# Patient Record
Sex: Male | Born: 1975 | Race: White | Hispanic: No | Marital: Married | Smoking: Former smoker
Health system: Southern US, Community
[De-identification: ages and names within clinical notes are randomized; demographics above are authoritative.]

## PROBLEM LIST (undated history)

## (undated) DIAGNOSIS — F1020 Alcohol dependence, uncomplicated: Secondary | ICD-10-CM

## (undated) DIAGNOSIS — F4310 Post-traumatic stress disorder, unspecified: Secondary | ICD-10-CM

## (undated) DIAGNOSIS — S069XAA Unspecified intracranial injury with loss of consciousness status unknown, initial encounter: Secondary | ICD-10-CM

## (undated) DIAGNOSIS — K5909 Other constipation: Secondary | ICD-10-CM

## (undated) DIAGNOSIS — S069X9A Unspecified intracranial injury with loss of consciousness of unspecified duration, initial encounter: Secondary | ICD-10-CM

## (undated) HISTORY — DX: Unspecified intracranial injury with loss of consciousness of unspecified duration, initial encounter: S06.9X9A

## (undated) HISTORY — DX: Alcohol dependence, uncomplicated: F10.20

## (undated) HISTORY — DX: Unspecified intracranial injury with loss of consciousness status unknown, initial encounter: S06.9XAA

## (undated) HISTORY — DX: Other constipation: K59.09

## (undated) HISTORY — PX: MYRINGOTOMY: SHX2060

---

## 2006-08-14 ENCOUNTER — Ambulatory Visit: Payer: Self-pay | Admitting: Family Medicine

## 2006-08-25 DIAGNOSIS — F17200 Nicotine dependence, unspecified, uncomplicated: Secondary | ICD-10-CM | POA: Insufficient documentation

## 2006-08-25 DIAGNOSIS — H6120 Impacted cerumen, unspecified ear: Secondary | ICD-10-CM

## 2006-09-24 ENCOUNTER — Encounter: Payer: Self-pay | Admitting: Family Medicine

## 2006-09-24 LAB — CONVERTED CEMR LAB
AST: 23 units/L (ref 0–37)
Albumin: 4.5 g/dL (ref 3.5–5.2)
Alkaline Phosphatase: 58 units/L (ref 39–117)
BUN: 10 mg/dL (ref 6–23)
Glucose, Bld: 79 mg/dL (ref 70–99)
HDL: 51 mg/dL (ref >39)
Hemoglobin: 14.8 g/dL (ref 13.9–16.8)
LDL Cholesterol: 64 mg/dL (ref 0–99)
MCHC: 34.1 g/dL (ref 33.1–35.4)
MCV: 91.2 fL (ref 78.8–100.0)
Potassium: 4.8 meq/L (ref 3.5–5.3)
RDW: 13.6 % (ref 11.5–15.3)
Total Bilirubin: 0.4 mg/dL (ref 0.3–1.2)
Total CHOL/HDL Ratio: 2.6
Triglycerides: 99 mg/dL (ref <150)
VLDL: 20 mg/dL (ref 0–40)

## 2006-09-28 ENCOUNTER — Encounter: Payer: Self-pay | Admitting: Family Medicine

## 2007-02-26 ENCOUNTER — Ambulatory Visit: Payer: Self-pay | Admitting: Family Medicine

## 2009-02-07 ENCOUNTER — Ambulatory Visit: Payer: Self-pay | Admitting: Family Medicine

## 2009-02-07 DIAGNOSIS — J189 Pneumonia, unspecified organism: Secondary | ICD-10-CM | POA: Insufficient documentation

## 2009-02-07 DIAGNOSIS — J029 Acute pharyngitis, unspecified: Secondary | ICD-10-CM | POA: Insufficient documentation

## 2009-02-07 LAB — CONVERTED CEMR LAB: Rapid Strep: NEGATIVE

## 2009-03-28 ENCOUNTER — Ambulatory Visit: Payer: Self-pay | Admitting: Family Medicine

## 2009-03-30 ENCOUNTER — Ambulatory Visit: Payer: Self-pay | Admitting: Family Medicine

## 2009-03-30 DIAGNOSIS — H6092 Unspecified otitis externa, left ear: Secondary | ICD-10-CM

## 2009-04-02 ENCOUNTER — Encounter: Payer: Self-pay | Admitting: Family Medicine

## 2009-04-26 ENCOUNTER — Encounter: Payer: Self-pay | Admitting: Family Medicine

## 2009-06-06 ENCOUNTER — Ambulatory Visit: Payer: Self-pay | Admitting: Family Medicine

## 2009-06-18 ENCOUNTER — Ambulatory Visit: Payer: Self-pay | Admitting: Family Medicine

## 2009-06-18 DIAGNOSIS — M26629 Arthralgia of temporomandibular joint, unspecified side: Secondary | ICD-10-CM | POA: Insufficient documentation

## 2009-10-22 ENCOUNTER — Ambulatory Visit: Payer: Self-pay | Admitting: Family Medicine

## 2009-10-22 DIAGNOSIS — K5909 Other constipation: Secondary | ICD-10-CM

## 2009-10-22 DIAGNOSIS — R0789 Other chest pain: Secondary | ICD-10-CM

## 2009-10-23 LAB — CONVERTED CEMR LAB
AST: 23 units/L (ref 0–37)
Albumin: 4.8 g/dL (ref 3.5–5.2)
Alkaline Phosphatase: 61 units/L (ref 39–117)
Basophils Absolute: 0 10*3/uL (ref 0.0–0.1)
Basophils Relative: 1 % (ref 0–1)
Glucose, Bld: 81 mg/dL (ref 70–99)
Hemoglobin: 15 g/dL (ref 13.0–17.0)
LDL Cholesterol: 127 mg/dL — ABNORMAL HIGH (ref 0–99)
Lymphocytes Relative: 35 % (ref 12–46)
MCHC: 33.7 g/dL (ref 30.0–36.0)
Monocytes Absolute: 0.5 10*3/uL (ref 0.1–1.0)
Neutro Abs: 2.9 10*3/uL (ref 1.7–7.7)
Neutrophils Relative %: 50 % (ref 43–77)
Platelets: 195 10*3/uL (ref 150–400)
Potassium: 4.4 meq/L (ref 3.5–5.3)
RDW: 12.7 % (ref 11.5–15.5)
Sodium: 137 meq/L (ref 135–145)
TSH: 0.838 microintl units/mL (ref 0.350–4.500)
Total Bilirubin: 0.4 mg/dL (ref 0.3–1.2)
Total Protein: 7.3 g/dL (ref 6.0–8.3)
Triglycerides: 76 mg/dL (ref <150)
VLDL: 15 mg/dL (ref 0–40)

## 2011-09-23 ENCOUNTER — Encounter: Payer: Self-pay | Admitting: Family Medicine

## 2011-09-23 ENCOUNTER — Ambulatory Visit (INDEPENDENT_AMBULATORY_CARE_PROVIDER_SITE_OTHER): Admitting: Family Medicine

## 2011-09-23 VITALS — BP 111/71 | HR 60 | Ht 71.0 in | Wt 181.0 lb

## 2011-09-23 DIAGNOSIS — R599 Enlarged lymph nodes, unspecified: Secondary | ICD-10-CM

## 2011-09-23 DIAGNOSIS — L678 Other hair color and hair shaft abnormalities: Secondary | ICD-10-CM

## 2011-09-23 DIAGNOSIS — H6692 Otitis media, unspecified, left ear: Secondary | ICD-10-CM

## 2011-09-23 DIAGNOSIS — L738 Other specified follicular disorders: Secondary | ICD-10-CM

## 2011-09-23 DIAGNOSIS — R591 Generalized enlarged lymph nodes: Secondary | ICD-10-CM

## 2011-09-23 DIAGNOSIS — L739 Follicular disorder, unspecified: Secondary | ICD-10-CM

## 2011-09-23 DIAGNOSIS — H6690 Otitis media, unspecified, unspecified ear: Secondary | ICD-10-CM

## 2011-09-23 MED ORDER — CLINDAMYCIN PHOSPHATE 1 % EX SOLN: CUTANEOUS | Status: AC

## 2011-09-23 MED ORDER — AMOXICILLIN-POT CLAVULANATE 875-125 MG PO TABS: 1.0000 | ORAL_TABLET | Freq: Two times a day (BID) | ORAL | Status: AC

## 2011-09-23 NOTE — Patient Instructions (Signed)
Lymphadenopathy Lymphadenopathy means "disease of the lymph glands." But the term is usually used to describe swollen or enlarged lymph glands, also called lymph nodes. These are the bean-shaped organs found in many locations including the neck, underarm, and groin. Lymph glands are part of the immune system, which fights infections in your body. Lymphadenopathy can occur in just one area of the body, such as the neck, or it can be generalized, with lymph node enlargement in several areas. The nodes found in the neck are the most common sites of lymphadenopathy. CAUSES  When your immune system responds to germs (such as viruses or bacteria ), infection-fighting cells and fluid build up. This causes the glands to grow in size. This is usually not something to worry about. Sometimes, the glands themselves can become infected and inflamed. This is called lymphadenitis. Enlarged lymph nodes can be caused by many diseases:  Bacterial disease, such as strep throat or a skin infection.   Viral disease, such as a common cold.   Other germs, such as lyme disease, tuberculosis, or sexually transmitted diseases.   Cancers, such as lymphoma (cancer of the lymphatic system) or leukemia (cancer of the white blood cells).   Inflammatory diseases such as lupus or rheumatoid arthritis.   Reactions to medications.  Many of the diseases above are rare, but important. This is why you should see your caregiver if you have lymphadenopathy. SYMPTOMS   Swollen, enlarged lumps in the neck, back of the head or other locations.   Tenderness.   Warmth or redness of the skin over the lymph nodes.   Fever.  DIAGNOSIS  Enlarged lymph nodes are often near the source of infection. They can help healthcare providers diagnose your illness. For instance:   Swollen lymph nodes around the jaw might be caused by an infection in the mouth.   Enlarged glands in the neck often signal a throat infection.   Lymph nodes that  are swollen in more than one area often indicate an illness caused by a virus.  Your caregiver most likely will know what is causing your lymphadenopathy after listening to your history and examining you. Blood tests, x-rays or other tests may be needed. If the cause of the enlarged lymph node cannot be found, and it does not go away by itself, then a biopsy may be needed. Your caregiver will discuss this with you. TREATMENT  Treatment for your enlarged lymph nodes will depend on the cause. Many times the nodes will shrink to normal size by themselves, with no treatment. Antibiotics or other medicines may be needed for infection. Only take over-the-counter or prescription medicines for pain, discomfort or fever as directed by your caregiver. HOME CARE INSTRUCTIONS  Swollen lymph glands usually return to normal when the underlying medical condition goes away. If they persist, contact your health-care provider. He/she might prescribe antibiotics or other treatments, depending on the diagnosis. Take any medications exactly as prescribed. Keep any follow-up appointments made to check on the condition of your enlarged nodes.  SEEK MEDICAL CARE IF:   Swelling lasts for more than two weeks.   You have symptoms such as weight loss, night sweats, fatigue or fever that does not go away.   The lymph nodes are hard, seem fixed to the skin or are growing rapidly.   Skin over the lymph nodes is red and inflamed. This could mean there is an infection.  SEEK IMMEDIATE MEDICAL CARE IF:   Fluid starts leaking from the area of the   enlarged lymph node.   You develop a fever of 102 F (38.9 C) or greater.   Severe pain develops (not necessarily at the site of a large lymph node).   You develop chest pain or shortness of breath.   You develop worsening abdominal pain.  MAKE SURE YOU:   Understand these instructions.   Will watch your condition.   Will get help right away if you are not doing well or get  worse.  Document Released: 08/12/2008 Document Revised: 07/16/2011 Document Reviewed: 08/12/2008 [facility] Patient Information 2012 [place], [place].Otitis Media with Effusion Otitis media with effusion is the presence of fluid in the middle ear. This is a common problem that often follows ear infections. It may be present for weeks or longer after the infection. Unlike an acute ear infection, otits media with effusion refers only to fluid behind the ear drum and not infection. Children with repeated ear and sinus infections and allergy problems are the most likely to get otitis media with effusion. CAUSES  The most frequent cause of the fluid buildup is dysfunction of the eustacian tubes. These are the tubes that drain fluid in the ears to the throat. SYMPTOMS   The main symptom of this condition is hearing loss. As a result, you or your child may:   Listen to the TV at a loud volume.   Not respond to questions.   Ask "what" often when spoken to.   There may be a sensation of fullness or pressure but usually not pain.  DIAGNOSIS   Your caregiver will diagnose this condition by examining you or your child's ears.   Your caregiver may test the pressure in you or your child's ear with a tympanometer.   A hearing test may be conducted if the problem persists.   A caregiver will want to re-evaluate the condition periodically to see if it improves.  TREATMENT   Treatment depends on the duration and the effects of the effusion.   Antibiotics, decongestants, nose drops, and cortisone-type drugs may not be helpful.   Children with persistent ear effusions may have delayed language. Children at risk for developmental delays in hearing, learning, and speech may require referral to a specialist earlier than children not at risk.   You or your child's caregiver may suggest a referral to an Ear, Nose, and Throat (ENT) surgeon for treatment. The following may help restore normal hearing:    Drainage of fluid.   Placement of ear tubes (tympanostomy tubes).   Removal of adenoids (adenoidectomy).  HOME CARE INSTRUCTIONS   Avoid second hand smoke.   Infants who are breast fed are less likely to have this condition.   Avoid feeding infants while laying flat.   Avoid known environmental allergens.   Be sure to see a caregiver or an ENT specialist for follow up.   Avoid people who are sick.  SEEK MEDICAL CARE IF:   Hearing is not better in 3 months.   Hearing is worse.   Ear pain.   Drainage from the ear.   Dizziness.  Document Released: 12/11/2004 Document Revised: 07/16/2011 Document Reviewed: 03/26/2010 [facility] Patient Information 2012 [place], [place].Folliculitis  Folliculitis is an infection and inflammation of the hair follicles. Hair follicles become red and irritated. This inflammation is usually caused by bacteria. The bacteria thrive in warm, moist environments. This condition can be seen anywhere on the body.  CAUSES The most common cause of folliculitis is an infection by germs (bacteria). Fungal and viral infections can  also cause the condition. Viral infections may be more common in people whose bodies are unable to fight disease well (weakened immune systems). Examples include people with:  AIDS.   An organ transplant.   Cancer.  People with depressed immune systems, diabetes, or obesity, have a greater risk of getting folliculitis than the general population. Certain chemicals, especially oils and tars, also can cause folliculitis. SYMPTOMS  An early sign of folliculitis is a small, white or yellow pus-filled, itchy lesion (pustule). These lesions appear on a red, inflamed follicle. They are usually less than 5 mm (.20 inches).   The most likely starting points are the scalp, thighs, legs, back and buttocks. Folliculitis is also frequently found in areas of repeated shaving.   When an infection of the follicle goes deeper, it becomes a boil  or furuncle. A group of closely packed boils create a larger lesion (a carbuncle). These sores (lesions) tend to occur in hairy, sweaty areas of the body.  TREATMENT   A doctor who specializes in skin problems (dermatologists) treats mild cases of folliculitis with antiseptic washes.   They also use a skin application which kills germs (topical antibiotics). Tea tree oil is a good topical antiseptic as well. It can be found at a health food store. A small percentage of individuals may develop an allergy to the tea tree oil.   Mild to moderate boils respond well to warm water compresses applied three times daily.   In some cases, oral antibiotics should be taken with the skin treatment.   If lesions contain large quantities of pus or fluid, your caregiver may drain them. This allows the topical antibiotics to get to the affected areas better.   Stubborn cases of folliculitis may respond to laser hair removal. This process uses a high intensity light beam (a laser) to destroy the follicle and reduces the scarring from folliculitis. After laser hair removal, hair will no longer grow in the laser treated area.  Patients with long-lasting folliculitis need to find out where the infection is coming from. Germs can live in the nostrils of the patient. This can trigger an outbreak now and then. Sometimes the bacteria live in the nostrils of a family member. This person does not develop the disorder but they repeatedly re-expose others to the germ. To break the cycle of recurrence in the patient, the family member must also undergo treatment. PREVENTION   Individuals who are predisposed to folliculitis should be extremely careful about personal hygiene.   Application of antiseptic washes may help prevent recurrences.   A topical antibiotic cream, mupirocin (Bactroban), has been effective at reducing bacteria in the nostrils. It is applied inside the nose with your little finger. This is done twice daily  for a week. Then it is repeated every 6 months.   Because follicle disorders tend to come back, patients must receive follow-up care. Your caregiver may be able to recognize a recurrence before it becomes severe.  SEEK IMMEDIATE MEDICAL CARE IF:   You develop redness, swelling, or increasing pain in the area.   You have a fever.   You are not improving with treatment or are getting worse.   You have any other questions or concerns.  Document Released: 01/12/2002 Document Revised: 07/16/2011 Document Reviewed: 11/08/2008 [facility] Patient Information 2012 [place], [place].
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
## 2011-10-07 ENCOUNTER — Encounter: Payer: Self-pay | Admitting: Family Medicine

## 2011-10-17 ENCOUNTER — Encounter: Payer: Self-pay | Admitting: Family Medicine

## 2011-10-23 ENCOUNTER — Ambulatory Visit: Admitting: Family Medicine

## 2011-11-18 HISTORY — PX: OTHER SURGICAL HISTORY: SHX169

## 2011-11-20 ENCOUNTER — Ambulatory Visit: Admitting: Family Medicine

## 2012-02-25 ENCOUNTER — Encounter: Payer: Self-pay | Admitting: *Deleted

## 2012-02-25 ENCOUNTER — Emergency Department (INDEPENDENT_AMBULATORY_CARE_PROVIDER_SITE_OTHER)
Admission: EM | Admit: 2012-02-25 | Discharge: 2012-02-25 | Disposition: A | Discharge status: Home / Self Care | Source: Home / Self Care

## 2012-02-25 DIAGNOSIS — M545 Low back pain, unspecified: Secondary | ICD-10-CM
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
## 2012-02-26 ENCOUNTER — Telehealth: Payer: Self-pay | Admitting: *Deleted

## 2012-02-27 ENCOUNTER — Telehealth: Payer: Self-pay | Admitting: *Deleted

## 2012-02-27 NOTE — ED Notes (Signed)
Spoke to pts wife given MRI results. Faxed results to Dr. Lazaro Arms office for appt on 03/01/12.

## 2012-03-01 ENCOUNTER — Ambulatory Visit: Admitting: Family Medicine

## 2013-04-15 ENCOUNTER — Telehealth: Payer: Self-pay

## 2013-04-15 DIAGNOSIS — F4310 Post-traumatic stress disorder, unspecified: Secondary | ICD-10-CM

## 2013-04-15 NOTE — Telephone Encounter (Signed)
No problem. Referral placed. Hopefully they will contact him next week. They can ask him if he would prefer to see a psychiatrist who prescribes medication or more of a therapist.

## 2013-04-15 NOTE — Telephone Encounter (Signed)
Charles Crosby needs a referral for [facility]. He has PTSD.

## 2013-04-29 ENCOUNTER — Encounter (HOSPITAL_COMMUNITY): Payer: Self-pay | Admitting: Psychiatry

## 2013-04-29 ENCOUNTER — Ambulatory Visit (INDEPENDENT_AMBULATORY_CARE_PROVIDER_SITE_OTHER): Payer: Federal, State, Local not specified - PPO | Admitting: Psychiatry

## 2013-04-29 VITALS — BP 108/69 | HR 60 | Ht 69.75 in | Wt 172.0 lb

## 2013-04-29 DIAGNOSIS — F1910 Other psychoactive substance abuse, uncomplicated: Secondary | ICD-10-CM

## 2013-04-29 DIAGNOSIS — F1020 Alcohol dependence, uncomplicated: Secondary | ICD-10-CM

## 2013-04-29 DIAGNOSIS — F4310 Post-traumatic stress disorder, unspecified: Secondary | ICD-10-CM

## 2013-04-29 MED ORDER — SERTRALINE HCL 100 MG PO TABS: 100.0000 mg | ORAL_TABLET | Freq: Every day | ORAL | Status: DC

## 2013-04-29 MED ORDER — TRAZODONE HCL 50 MG PO TABS: ORAL_TABLET | ORAL | Status: DC
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
## 2013-05-13 ENCOUNTER — Ambulatory Visit (INDEPENDENT_AMBULATORY_CARE_PROVIDER_SITE_OTHER): Payer: Federal, State, Local not specified - PPO | Admitting: Psychiatry

## 2013-05-13 ENCOUNTER — Encounter (HOSPITAL_COMMUNITY): Payer: Self-pay | Admitting: Psychiatry

## 2013-05-13 VITALS — BP 108/68 | HR 65 | Wt 174.0 lb

## 2013-05-13 DIAGNOSIS — F1020 Alcohol dependence, uncomplicated: Secondary | ICD-10-CM

## 2013-05-13 DIAGNOSIS — F4310 Post-traumatic stress disorder, unspecified: Secondary | ICD-10-CM

## 2013-05-13 DIAGNOSIS — F1910 Other psychoactive substance abuse, uncomplicated: Secondary | ICD-10-CM
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
## 2013-05-19 ENCOUNTER — Ambulatory Visit (INDEPENDENT_AMBULATORY_CARE_PROVIDER_SITE_OTHER): Payer: Federal, State, Local not specified - PPO | Admitting: Sports Medicine

## 2013-05-19 ENCOUNTER — Encounter: Payer: Self-pay | Admitting: Sports Medicine

## 2013-05-19 VITALS — BP 114/74 | HR 67 | Wt 176.0 lb

## 2013-05-19 DIAGNOSIS — S61209A Unspecified open wound of unspecified finger without damage to nail, initial encounter: Secondary | ICD-10-CM

## 2013-05-19 DIAGNOSIS — S61213A Laceration without foreign body of left middle finger without damage to nail, initial encounter: Secondary | ICD-10-CM | POA: Insufficient documentation

## 2013-05-19 MED ORDER — HYDROCODONE-ACETAMINOPHEN 5-325 MG PO TABS: 1.0000 | ORAL_TABLET | Freq: Three times a day (TID) | ORAL | Status: DC | PRN

## 2013-05-19 NOTE — Assessment & Plan Note (Signed)
This occurred about 24 hours ago. Up-to-date on tetanus. Sutured at [facility] in [place]. Was having significant pain, hydrocodone prescribed. No signs of infection, [facility] will remove the sutures.

## 2013-05-19 NOTE — Progress Notes (Signed)
  Subjective:    CC: Finger injury  HPI: Charles Crosby accidentally cut his left middle finger on a table saw. He went to [facility] in [place], and had sutures placed. He was not given any pain medication, and comes here for further advice. He already has a followup scheduled for suture removal. Pain is localized, doesn't radiate, moderate. He denies any fevers, chills, drainage from the incision.  Past medical history, Surgical history, Family history not pertinant except as noted below, Social history, Allergies, and medications have been entered into the medical record, reviewed, and no changes needed.   Review of Systems: No fevers, chills, night sweats, weight loss, chest pain, or shortness of breath.   Objective:    General: Well Developed, well nourished, and in no acute distress.  Neuro: Alert and oriented x3, extra-ocular muscles intact, sensation grossly intact.  HEENT: Normocephalic, atraumatic, pupils equal round reactive to light, neck supple, no masses, no lymphadenopathy, thyroid nonpalpable.  Skin: Warm and dry, no rashes. Cardiac: Regular rate and rhythm, no murmurs rubs or gallops, no lower extremity edema.  Respiratory: Clear to auscultation bilaterally. Not using accessory muscles, speaking in full sentences. Left hand: Simple interrupted sutures visible and well placed in the left middle finger pad. There is no erythema, no induration, there is minimal tenderness to palpation, he has excellent function to the flexor digitorum superficialis, profundus, as well as the extensors. He is neurovascularly intact distally.  Impression and Recommendations:

## 2013-05-23 ENCOUNTER — Ambulatory Visit: Payer: Self-pay | Admitting: Sports Medicine

## 2013-06-09 ENCOUNTER — Ambulatory Visit (HOSPITAL_COMMUNITY): Payer: Self-pay | Admitting: Psychiatry

## 2013-06-21 ENCOUNTER — Encounter (HOSPITAL_COMMUNITY): Payer: Self-pay | Admitting: Psychiatry

## 2013-06-21 ENCOUNTER — Ambulatory Visit (INDEPENDENT_AMBULATORY_CARE_PROVIDER_SITE_OTHER): Payer: Federal, State, Local not specified - PPO | Admitting: Psychiatry

## 2013-06-21 VITALS — BP 99/68 | HR 72 | Ht 69.75 in | Wt 178.0 lb

## 2013-06-21 DIAGNOSIS — F1020 Alcohol dependence, uncomplicated: Secondary | ICD-10-CM

## 2013-06-21 DIAGNOSIS — F4310 Post-traumatic stress disorder, unspecified: Secondary | ICD-10-CM

## 2013-06-21 DIAGNOSIS — F1910 Other psychoactive substance abuse, uncomplicated: Secondary | ICD-10-CM

## 2013-06-21 MED ORDER — SERTRALINE HCL 25 MG PO TABS: ORAL_TABLET | ORAL | Status: DC

## 2013-06-21 MED ORDER — FLUOXETINE HCL 10 MG PO CAPS: ORAL_CAPSULE | ORAL | Status: DC
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
## 2013-06-30 ENCOUNTER — Other Ambulatory Visit (HOSPITAL_COMMUNITY): Payer: Self-pay | Admitting: Psychiatry

## 2013-07-04 ENCOUNTER — Telehealth (HOSPITAL_COMMUNITY): Payer: Self-pay
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
## 2013-07-08 ENCOUNTER — Ambulatory Visit (HOSPITAL_COMMUNITY): Payer: Self-pay | Admitting: Psychiatry

## 2013-07-20 ENCOUNTER — Telehealth (HOSPITAL_COMMUNITY): Payer: Self-pay | Admitting: Psychiatry

## 2013-07-20 NOTE — Telephone Encounter (Signed)
Message copied by Larena Sox on Wed Jul 20, 2013  5:51 PM ------      Message from: Larena Sox      Created: Fri Jul 15, 2013  5:49 PM       Call Patient and Dr. Linford Arnold ------

## 2013-07-20 NOTE — Telephone Encounter (Signed)
Called patient's wife, as the patient has not called Dr. Linford Arnold regarding episodes of chest pains and SOB with exertion. Will send message to Dr. Linford Arnold.

## 2013-07-20 NOTE — Telephone Encounter (Signed)
Called patient. Left message asking him to speak to his PCP.

## 2013-07-21 NOTE — Telephone Encounter (Signed)
Spoke with wife and gave pt appt for 07/22/13 @ 11am with Dr. Linford Arnold. Pt unable to come in today due to being in [place] per wife.  Meyer Cory, LPN

## 2013-07-21 NOTE — Telephone Encounter (Signed)
Please try to call this pt to see if ha can come in.

## 2013-07-22 ENCOUNTER — Encounter: Payer: Self-pay | Admitting: Family Medicine

## 2013-07-22 ENCOUNTER — Ambulatory Visit (INDEPENDENT_AMBULATORY_CARE_PROVIDER_SITE_OTHER): Payer: Federal, State, Local not specified - PPO | Admitting: Psychiatry

## 2013-07-22 ENCOUNTER — Ambulatory Visit (INDEPENDENT_AMBULATORY_CARE_PROVIDER_SITE_OTHER): Payer: Federal, State, Local not specified - PPO | Admitting: Family Medicine

## 2013-07-22 ENCOUNTER — Encounter (HOSPITAL_COMMUNITY): Payer: Self-pay | Admitting: Psychiatry

## 2013-07-22 VITALS — BP 103/73 | HR 63 | Ht 69.75 in | Wt 172.0 lb

## 2013-07-22 VITALS — BP 98/61 | HR 69 | Wt 174.0 lb

## 2013-07-22 DIAGNOSIS — F4310 Post-traumatic stress disorder, unspecified: Secondary | ICD-10-CM

## 2013-07-22 DIAGNOSIS — R599 Enlarged lymph nodes, unspecified: Secondary | ICD-10-CM

## 2013-07-22 DIAGNOSIS — F1020 Alcohol dependence, uncomplicated: Secondary | ICD-10-CM

## 2013-07-22 DIAGNOSIS — R0789 Other chest pain: Secondary | ICD-10-CM

## 2013-07-22 DIAGNOSIS — Z720 Tobacco use: Secondary | ICD-10-CM

## 2013-07-22 DIAGNOSIS — R590 Localized enlarged lymph nodes: Secondary | ICD-10-CM

## 2013-07-22 DIAGNOSIS — F17200 Nicotine dependence, unspecified, uncomplicated: Secondary | ICD-10-CM

## 2013-07-22 DIAGNOSIS — F1910 Other psychoactive substance abuse, uncomplicated: Secondary | ICD-10-CM

## 2013-07-22 MED ORDER — FLUOXETINE HCL 40 MG PO CAPS: 40.0000 mg | ORAL_CAPSULE | Freq: Every day | ORAL | Status: DC
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
## 2013-07-22 NOTE — Progress Notes (Signed)
Subjective:    Patient ID: Charles Crosby, male    DOB: 04/21/1976, 37 y.o.   MRN: [id number]  HPI CP episodes on and off x 1 month. Sometimes a dull pressure or a sharp pain. Usu last about seconds if the sharp pain. If it is a dull pain can last a couple of hours.  Will wax and wane.  No relationship to esating or not eating. On his left side of his chest.  Will occ feel SOB once gets the BP.  No CP or SOB w/ exercise.  No fam hx of premature Hert dz. No GERD.  Using e-cigs and was taking in 6 Monster drinks a day.  Now has cut back to 2-3 drinks per day. No heart racing.  No prior hx of hear ot lung problems. No CP at nigth. No GERD. He does have a family history of thyroid problems. Also note that he is currently transitioning off the sertraline and onto fluoxetine. His anxiety has been elevated a little bit during this transition period.  Also has a lump that comes and goes behind his left ear.  Non tender. No fever.   Review of Systems BP 98/61  Pulse 69  Wt 174 lb (78.926 kg)  BMI 25.14 kg/m2  SpO2 96%    No Known Allergies  Past Medical History  Diagnosis Date  . Alcohol dependency     history  . Constipation, chronic   . Hearing loss     Both ears, left ear is worse    Past Surgical History  Procedure Laterality Date  . Myringotomy    . Vasectomy  2013    History   Social History  . Marital Status: Married    Spouse Name: N/A    Number of Children: N/A  . Years of Education: N/A   Occupational History  . Not on file.   Social History Main Topics  . Smoking status: Former Smoker -- 1.00 packs/day for 12 years  . Smokeless tobacco: Current User    Types: Chew     Comment: Currently using e-cigarette-15 mg  . Alcohol Use: No     Comment: one relapse since March 13, 2013  . Drug Use: No  . Sexual Activity: Yes    Partners: Male     Comment: Vasectomy   Other Topics Concern  . Not on file   Social History Narrative  . No narrative on file    Family  History  Problem Relation Age of Onset  . Hypertension Father   . Hyperlipidemia Father   . Thyroid disease Father   . Alcohol abuse Neg Hx   . OCD Neg Hx   . Paranoid behavior Neg Hx   . Schizophrenia Neg Hx   . Diabetes type II Neg Hx     Outpatient Encounter Prescriptions as of 07/22/2013  Medication Sig Dispense Refill  . FLUoxetine (PROZAC) 40 MG capsule Take 1 capsule (40 mg total) by mouth at bedtime.  30 capsule  1  . sertraline (ZOLOFT) 25 MG tablet 50 mg. 50 mg (2 tablets)  for 7 days, then 25 mg  (1 tablets)for 7 days then stop.       No facility-administered encounter medications on file as of 07/22/2013.          Objective:   Physical Exam  Constitutional: He is oriented to person, place, and time. He appears well-developed and well-nourished.  HENT:  Head: Normocephalic and atraumatic.  Neck: Neck supple. No thyromegaly  present.  Cardiovascular: Normal rate, regular rhythm and normal heart sounds.   Radial pulse 2+, pedal pulse 2+, no carotid bruits.  Pulmonary/Chest: Effort normal and breath sounds normal.  Musculoskeletal: He exhibits no edema.  Lymphadenopathy:    He has no cervical adenopathy.  Neurological: He is alert and oriented to person, place, and time.  Skin: Skin is warm and dry.  Swollen LN post to left ear. Also has some excoriated papules on teh scalp near the left ear as well.    Psychiatric: He has a normal mood and affect. His behavior is normal.     The lymph node is approximately 1 x 0.5 cm in size. No erythema overlying the lymph node. No significant tenderness.     Assessment & Plan:  Atypical CP  - fortunately, he is not having any pain or problems during exercise which is fantastic. He says no major risk factors like high blood pressure or hyperlipidemia. In fact he has great blood pressure and pulse today. He does use the ease cigarettes which is concerning. I padded couple patients but experienced chest pain with using these. I  encouraged cessation. Will get additional blood work to rule out anemia, thyroid disease, and check cardiac enzymes. He EKG was reassuring today. EKG showed rate of 57 beats per minute, normal sinus rhythm, with no acute ST-T wave changes and normal axis. Strongly encouraged him to also cut back on his caffeine intake. It is a stimulant and typically can cause palpitations and chest pain.  No GERD sxs.   Reactive LN - explained lymph node is most likely just reactive to excoriated papules on his scalp. Encouraged him to not pick at them or scratch at them and the lymph node should resolve on its own over the next 3-4 weeks. He notices it getting larger, turning red or getting tender or he feels it's getting more Farmer hard liquor walk he needs to return for further evaluation.

## 2013-07-23 ENCOUNTER — Telehealth: Payer: Self-pay | Admitting: Sports Medicine

## 2013-07-23 LAB — COMPLETE METABOLIC PANEL WITH GFR
ALT: 78 U/L — ABNORMAL HIGH (ref 0–53)
AST: 309 U/L — ABNORMAL HIGH (ref 0–37)
Albumin: 4.2 g/dL (ref 3.5–5.2)
Alkaline Phosphatase: 53 U/L (ref 39–117)
Calcium: 9.1 mg/dL (ref 8.4–10.5)
Chloride: 104 mEq/L (ref 96–112)
Potassium: 4.2 mEq/L (ref 3.5–5.3)
Sodium: 140 mEq/L (ref 135–145)
Total Protein: 6.6 g/dL (ref 6.0–8.3)

## 2013-07-23 LAB — CBC
MCH: 29.1 pg (ref 26.0–34.0)
MCHC: 33.7 g/dL (ref 30.0–36.0)
MCV: 86.6 fL (ref 78.0–100.0)
Platelets: 249 10*3/uL (ref 150–400)
RDW: 13 % (ref 11.5–15.5)
WBC: 6.5 10*3/uL (ref 4.0–10.5)

## 2013-07-23 LAB — CK TOTAL AND CKMB (NOT AT ARMC): Relative Index: 0.1 (ref 0.0–2.5)

## 2013-07-23 NOTE — Telephone Encounter (Addendum)
     Called by on-call-nurse regarding this patient, he is a healthy male with PTSD.  He regularly consumes 3+ energy drinks per day.  More recently he has been having episodes of atypical chest pain per both psychiatrist and PCP notes today.  CP comes when smoking.  Usually not exertional, mild SOB associated with CP per notes, can last a few hours, did not sound cardiac.  In office ECG was negative and enzymes were checked.  Unfortunately his CK total was ~15000 with MB component of 10.  Troponins were normal.  This does not represent an ACS but does likely represent moderate rhabdomyolysis which would benefit at least initially from some IVF.  Since I cannot examine him now I am sending him to the [facility] ED.  Patient will be called by answering service and directed to [facility] ED.  I have asked them to call me if they can't reach him.  Of note he is also tapering to a different SSRI, so currently may be on two so there is also a concern for serotonin syndrome.

## 2013-07-25 ENCOUNTER — Other Ambulatory Visit: Payer: Self-pay | Admitting: Family Medicine

## 2013-07-25 ENCOUNTER — Telehealth: Payer: Self-pay | Admitting: *Deleted

## 2013-07-25 ENCOUNTER — Encounter: Payer: Self-pay | Admitting: *Deleted

## 2013-07-25 ENCOUNTER — Encounter: Payer: Self-pay | Admitting: Family Medicine

## 2013-07-25 ENCOUNTER — Ambulatory Visit (INDEPENDENT_AMBULATORY_CARE_PROVIDER_SITE_OTHER): Payer: Federal, State, Local not specified - PPO | Admitting: Family Medicine

## 2013-07-25 VITALS — BP 103/62 | HR 71 | Wt 178.0 lb

## 2013-07-25 DIAGNOSIS — M6282 Rhabdomyolysis: Secondary | ICD-10-CM

## 2013-07-25 DIAGNOSIS — R748 Abnormal levels of other serum enzymes: Secondary | ICD-10-CM

## 2013-07-25 LAB — CK TOTAL AND CKMB (NOT AT ARMC)
CK, MB: 3.8 ng/mL (ref 0.3–4.0)
Relative Index: 0.2 (ref 0.0–2.5)
Total CK: 2511 U/L — ABNORMAL HIGH (ref 7–232)

## 2013-07-25 LAB — HEPATIC FUNCTION PANEL
AST: 311 U/L — ABNORMAL HIGH (ref 0–37)
Albumin: 4.3 g/dL (ref 3.5–5.2)
Bilirubin, Direct: 0.1 mg/dL (ref 0.0–0.3)
Total Bilirubin: 0.4 mg/dL (ref 0.3–1.2)

## 2013-07-25 NOTE — Progress Notes (Signed)
Called [facility] and spoke with [name] ask her to add on an acute hep panel to [id number].Charles Crosby

## 2013-07-25 NOTE — Telephone Encounter (Signed)
Lab orders faxed.  Meyer Cory, LPN

## 2013-07-25 NOTE — Progress Notes (Signed)
Subjective:    Patient ID: Charles Crosby, male    DOB: August 14, 1976, 37 y.o.   MRN: [id number]  HPI Micah Flesher ot ED and had IV fluids and was d/c home after CK had come down to about 9000. No CP since then. He is feeling well otherwise. He's been trying to stay really hydrated. About 3 days before he came in he had worked well but he says it was a normal routine. He did some upper body weights and ran about half a mile. Nothing excessive. The he had not been working out regularly before that for about 3 months. He had backed off on his caffeine energy drinks a couple days before as well. Otherwise not exactly sure what may have caused this. He denies any prior history or problems with his liver. No family history of GI or liver problems as well.   Review of Systems  BP 103/62  Pulse 71  Wt 178 lb (80.74 kg)  BMI 25.71 kg/m2    No Known Allergies  Past Medical History  Diagnosis Date  . Alcohol dependency     history  . Constipation, chronic   . Hearing loss     Both ears, left ear is worse    Past Surgical History  Procedure Laterality Date  . Myringotomy    . Vasectomy  2013    History   Social History  . Marital Status: Married    Spouse Name: N/A    Number of Children: N/A  . Years of Education: N/A   Occupational History  . Not on file.   Social History Main Topics  . Smoking status: Former Smoker -- 1.00 packs/day for 12 years  . Smokeless tobacco: Current User    Types: Chew     Comment: Currently using e-cigarette-15 mg  . Alcohol Use: No     Comment: one relapse since March 13, 2013  . Drug Use: No  . Sexual Activity: Yes    Partners: Male     Comment: Vasectomy   Other Topics Concern  . Not on file   Social History Narrative  . No narrative on file    Family History  Problem Relation Age of Onset  . Hypertension Father   . Hyperlipidemia Father   . Thyroid disease Father   . Alcohol abuse Neg Hx   . OCD Neg Hx   . Paranoid behavior Neg Hx   .  Schizophrenia Neg Hx   . Diabetes type II Neg Hx   . Thyroid disease Mother     Outpatient Encounter Prescriptions as of 07/25/2013  Medication Sig Dispense Refill  . FLUoxetine (PROZAC) 40 MG capsule Take 1 capsule (40 mg total) by mouth at bedtime.  30 capsule  1  . sertraline (ZOLOFT) 25 MG tablet 50 mg. 50 mg (2 tablets)  for 7 days, then 25 mg  (1 tablets)for 7 days then stop.       No facility-administered encounter medications on file as of 07/25/2013.          Objective:   Physical Exam  Constitutional: He is oriented to person, place, and time. He appears well-developed and well-nourished.  HENT:  Head: Normocephalic and atraumatic.  Cardiovascular: Normal rate, regular rhythm and normal heart sounds.   Pulmonary/Chest: Effort normal and breath sounds normal.  Abdominal: Soft. Bowel sounds are normal. He exhibits no distension and no mass. There is tenderness. There is no rebound and no guarding.  Mildly tender in the right lower  quadrant close to the umbilicus. Nontender in the right upper quadrant. I did not palpate any hepatosplenomegaly.  Neurological: He is alert and oriented to person, place, and time.  Skin: Skin is warm and dry.  Psychiatric: He has a normal mood and affect. His behavior is normal.          Assessment & Plan:  Rhabdomyolysis-repeat CT of the emergency room had drifted down a little bit. We rechecked his labs today. Hopefully his numbers we'll look better they're still pending. We should have his report back person in the morning. We will need to watch them until he comes back down to normal. In the meantime continue to hydrate really well and avoid exercising. Routine activity is fine. Avoid anything that is dehydrated.  Elevated liver enzymes-unfortunately his liver enzymes did not improve today compared to Friday. I would like to move forward with an ultrasound of the liver as well as an acute hepatitis panel.

## 2013-07-26 ENCOUNTER — Ambulatory Visit (INDEPENDENT_AMBULATORY_CARE_PROVIDER_SITE_OTHER): Payer: Federal, State, Local not specified - PPO

## 2013-07-26 ENCOUNTER — Encounter: Payer: Self-pay | Admitting: *Deleted

## 2013-07-26 DIAGNOSIS — K824 Cholesterolosis of gallbladder: Secondary | ICD-10-CM

## 2013-07-26 DIAGNOSIS — R748 Abnormal levels of other serum enzymes: Secondary | ICD-10-CM

## 2013-07-26 LAB — HEPATITIS PANEL, ACUTE
HCV Ab: NEGATIVE
Hep A IgM: NEGATIVE
Hep B C IgM: NEGATIVE
Hepatitis B Surface Ag: NEGATIVE

## 2013-08-01 ENCOUNTER — Other Ambulatory Visit: Payer: Self-pay | Admitting: Family Medicine

## 2013-08-01 ENCOUNTER — Telehealth: Payer: Self-pay | Admitting: *Deleted

## 2013-08-01 DIAGNOSIS — R7989 Other specified abnormal findings of blood chemistry: Secondary | ICD-10-CM

## 2013-08-01 NOTE — Telephone Encounter (Signed)
Labs entered.

## 2013-08-02 ENCOUNTER — Encounter: Payer: Self-pay | Admitting: Family Medicine

## 2013-08-02 ENCOUNTER — Telehealth: Payer: Self-pay | Admitting: Family Medicine

## 2013-08-02 ENCOUNTER — Ambulatory Visit (INDEPENDENT_AMBULATORY_CARE_PROVIDER_SITE_OTHER): Payer: Federal, State, Local not specified - PPO | Admitting: Family Medicine

## 2013-08-02 VITALS — BP 104/65 | HR 68 | Temp 98.4°F | Wt 176.0 lb

## 2013-08-02 DIAGNOSIS — R1012 Left upper quadrant pain: Secondary | ICD-10-CM

## 2013-08-02 DIAGNOSIS — R1013 Epigastric pain: Secondary | ICD-10-CM

## 2013-08-02 DIAGNOSIS — R51 Headache: Secondary | ICD-10-CM

## 2013-08-02 DIAGNOSIS — K228 Other specified diseases of esophagus: Secondary | ICD-10-CM

## 2013-08-02 MED ORDER — SUCRALFATE 1 GM/10ML PO SUSP: 1.0000 g | Freq: Four times a day (QID) | ORAL | Status: DC

## 2013-08-02 NOTE — Telephone Encounter (Signed)
Call the labs and see if can add liver enzymes

## 2013-08-02 NOTE — Progress Notes (Signed)
Subjective:    Patient ID: Charles Crosby, male    DOB: 1976-07-26, 37 y.o.   MRN: [id number]  HPI Starting yesterday noticed it was painful to swallow. He says he can feel the food moving from the back of his throat all the way down to the bottom of his chest to his stomach. Says it then feels like it gets stuck in the  Lower portion of the chest.  Says it happens with food and with liquids. It's worse with food. No acutal ST.  No vomiting.  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 He also reports that his does grind his teeth at night. Has also had a frontal HA.  Says he actually vomited last night bc it was making him nauseated. Still feels nauseated today. No abd pain. No diarrhea. Pain in the esophagus was worse when he laid down. He is still drinking carbonated energy drinks but says he has cut back. He is still using the cigarettes as well.  No fever, chills or sweats.   Review of Systems  BP 104/65  Pulse 68  Temp(Src) 98.4 F (36.9 C) (Oral)  Wt 176 lb (79.833 kg)  BMI 25.42 kg/m2    No Known Allergies  Past Medical History  Diagnosis Date  . Alcohol dependency     history  . Constipation, chronic   . Hearing loss     Both ears, left ear is worse    Past Surgical History  Procedure Laterality Date  . Myringotomy    . Vasectomy  2013    History   Social History  . Marital Status: Married    Spouse Name: N/A    Number of Children: N/A  . Years of Education: N/A   Occupational History  . Not on file.   Social History Main Topics  . Smoking status: Former Smoker -- 1.00 packs/day for 12 years  . Smokeless tobacco: Current User    Types: Chew     Comment: Currently using e-cigarette-15 mg  . Alcohol Use: No     Comment: one relapse since March 13, 2013  . Drug Use: No  . Sexual Activity: Yes    Partners: Male     Comment: Vasectomy   Other Topics Concern  . Not on file   Social History Narrative  . No narrative on file     Family History  Problem Relation Age of Onset  . Hypertension Father   . Hyperlipidemia Father   . Thyroid disease Father   . Alcohol abuse Neg Hx   . OCD Neg Hx   . Paranoid behavior Neg Hx   . Schizophrenia Neg Hx   . Diabetes type II Neg Hx   . Thyroid disease Mother     Outpatient Encounter Prescriptions as of 08/02/2013  Medication Sig Dispense Refill  . FLUoxetine (PROZAC) 40 MG capsule Take 1 capsule (40 mg total) by mouth at bedtime.  30 capsule  1  . sertraline (ZOLOFT) 25 MG tablet 50 mg. 50 mg (2 tablets)  for 7 days, then 25 mg  (1 tablets)for 7 days then stop.      . sucralfate (CARAFATE) 1 GM/10ML suspension Take 10 mLs (1 g total) by mouth 4 (four) times daily.  420 mL  0   No facility-administered encounter medications on file as of 08/02/2013.          Objective:   Physical Exam  Constitutional: He is oriented to person,  place, and time. He appears well-developed and well-nourished.  HENT:  Head: Normocephalic and atraumatic.  Cardiovascular: Normal rate, regular rhythm and normal heart sounds.   Pulmonary/Chest: Effort normal and breath sounds normal.  Abdominal: Soft. Bowel sounds are normal. He exhibits no distension and no mass. There is tenderness. There is no rebound and no guarding.  TTP in the epigastrium, left upper quadrantt, and below umbilicus.   Neurological: He is alert and oriented to person, place, and time.  Skin: Skin is warm and dry.  Psychiatric: He has a normal mood and affect. His behavior is normal.          Assessment & Plan:  Esophageal pain-discussed with him most likely diagnosis is esophagitis. He does have a prior history of reflux. I gave him some samples of dexilant and also central prescription for Carafate to take before each meal to cut the stomach. If this is the cause then he should get symptomatic relief within 2-3 days. Also consider that he could have an esophageal stricture that typically the symptoms are more  gradual. Also consider esophageal spasm, for example nutcracker esophagus. This could certainly explain his sudden onset of symptoms. We will first start with a PPI and the Carafate. Because he was tender over the abdomen even though he has not complained of abdominal pain I went ahead and got a CBC with differential as well as amylase and lipase since he was also tender in the left upper quadrant. We discussed the importance of cutting back on caffeine, carbonated beverages, greasy spicy foods, acidic foods like citrus fruits, et Karie Soda.

## 2013-08-02 NOTE — Telephone Encounter (Signed)
Liver enzymes added on to specimen #[id number]  .Loralee Pacas [place]

## 2013-08-03 LAB — CBC WITH DIFFERENTIAL/PLATELET
Basophils Absolute: 0.1 10*3/uL (ref 0.0–0.1)
Lymphocytes Relative: 32 % (ref 12–46)
Lymphs Abs: 1.9 10*3/uL (ref 0.7–4.0)
Neutro Abs: 3.1 10*3/uL (ref 1.7–7.7)
Neutrophils Relative %: 52 % (ref 43–77)
Platelets: 269 10*3/uL (ref 150–400)
RBC: 4.72 MIL/uL (ref 4.22–5.81)
RDW: 12.9 % (ref 11.5–15.5)
WBC: 6 10*3/uL (ref 4.0–10.5)

## 2013-08-03 LAB — COMPLETE METABOLIC PANEL WITH GFR
ALT: 23 U/L (ref 0–53)
AST: 25 U/L (ref 0–37)
Albumin: 4.4 g/dL (ref 3.5–5.2)
Calcium: 9.8 mg/dL (ref 8.4–10.5)
Chloride: 103 mEq/L (ref 96–112)
Creat: 1.09 mg/dL (ref 0.50–1.35)
Potassium: 4 mEq/L (ref 3.5–5.3)
Sodium: 140 mEq/L (ref 135–145)
Total Protein: 7.1 g/dL (ref 6.0–8.3)

## 2013-08-03 LAB — LIPASE: Lipase: 20 U/L (ref 0–75)

## 2013-08-03 LAB — HEPATIC FUNCTION PANEL
ALT: 25 U/L (ref 0–53)
AST: 30 U/L (ref 0–37)
Albumin: 4.4 g/dL (ref 3.5–5.2)
Alkaline Phosphatase: 64 U/L (ref 39–117)
Total Bilirubin: 0.4 mg/dL (ref 0.3–1.2)

## 2013-08-08 ENCOUNTER — Ambulatory Visit: Payer: Self-pay | Admitting: Family Medicine

## 2013-08-16 ENCOUNTER — Ambulatory Visit: Payer: Self-pay | Admitting: Family Medicine

## 2013-08-19 ENCOUNTER — Ambulatory Visit (INDEPENDENT_AMBULATORY_CARE_PROVIDER_SITE_OTHER): Payer: Federal, State, Local not specified - PPO | Admitting: Psychiatry

## 2013-08-19 ENCOUNTER — Encounter (HOSPITAL_COMMUNITY): Payer: Self-pay | Admitting: Psychiatry

## 2013-08-19 VITALS — BP 92/61 | HR 76 | Ht 69.75 in | Wt 175.0 lb

## 2013-08-19 DIAGNOSIS — F1910 Other psychoactive substance abuse, uncomplicated: Secondary | ICD-10-CM

## 2013-08-19 DIAGNOSIS — F1020 Alcohol dependence, uncomplicated: Secondary | ICD-10-CM

## 2013-08-19 DIAGNOSIS — F4310 Post-traumatic stress disorder, unspecified: Secondary | ICD-10-CM

## 2013-08-19 MED ORDER — FLUOXETINE HCL 10 MG PO CAPS: ORAL_CAPSULE | ORAL | Status: DC

## 2013-08-19 MED ORDER — FLUOXETINE HCL 40 MG PO CAPS: 40.0000 mg | ORAL_CAPSULE | Freq: Every day | ORAL | Status: DC
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
## 2013-08-20 NOTE — Addendum Note (Signed)
Addended by: Larena Sox on: 08/20/2013 02:53 AM   Modules accepted: Level of Service

## 2013-08-22 ENCOUNTER — Telehealth (HOSPITAL_COMMUNITY): Payer: Self-pay

## 2013-08-22 NOTE — Telephone Encounter (Signed)
Called patient. Left message asking him to come to the office Wednesday to pick up letter.
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
## 2013-09-16 ENCOUNTER — Ambulatory Visit (HOSPITAL_COMMUNITY): Payer: Self-pay | Admitting: Psychiatry

## 2013-09-20 ENCOUNTER — Telehealth (HOSPITAL_COMMUNITY): Payer: Self-pay

## 2013-09-20 NOTE — Telephone Encounter (Signed)
Called patient. FMLA letter completed.

## 2013-09-26 ENCOUNTER — Encounter: Payer: Self-pay | Admitting: Family Medicine

## 2013-09-26 ENCOUNTER — Ambulatory Visit (INDEPENDENT_AMBULATORY_CARE_PROVIDER_SITE_OTHER): Payer: Federal, State, Local not specified - PPO | Admitting: Family Medicine

## 2013-09-26 VITALS — BP 97/55 | HR 66 | Temp 97.9°F | Wt 181.0 lb

## 2013-09-26 DIAGNOSIS — K921 Melena: Secondary | ICD-10-CM

## 2013-09-26 NOTE — Progress Notes (Signed)
Subjective:    Patient ID: Charles Crosby, male    DOB: 19-Mar-1976, 37 y.o.   MRN: [id number]  HPI bright red x 4-5 days pt stated that he has normally had to strain w/BM so this is not unusual for him he has not noticed any hemorrhoids.  Bright red on the toilet paper.  No pain wth BM.  No fever, chills or sweat. No fam hx of crohns disease. No abd pain, cramping or bloating.  Stools more loose today. No nausea. Son with stomach bug on Friday night .    Review of Systems     BP 97/55  Pulse 66  Temp(Src) 97.9 F (36.6 C)  Wt 181 lb (82.101 kg)    No Known Allergies  Past Medical History  Diagnosis Date  . Alcohol dependency     history  . Constipation, chronic   . Hearing loss     Both ears, left ear is worse    Past Surgical History  Procedure Laterality Date  . Myringotomy    . Vasectomy  2013    History   Social History  . Marital Status: Married    Spouse Name: N/A    Number of Children: N/A  . Years of Education: N/A   Occupational History  . Not on file.   Social History Main Topics  . Smoking status: Former Smoker -- 1.00 packs/day for 12 years  . Smokeless tobacco: Current User    Types: Chew     Comment: Currently using e-cigarette-12 mg  . Alcohol Use: No     Comment: one relapse since March 13, 2013  . Drug Use: No  . Sexual Activity: Yes    Partners: Male     Comment: Vasectomy   Other Topics Concern  . Not on file   Social History Narrative  . No narrative on file    Family History  Problem Relation Age of Onset  . Hypertension Father   . Hyperlipidemia Father   . Thyroid disease Father   . Alcohol abuse Neg Hx   . OCD Neg Hx   . Paranoid behavior Neg Hx   . Schizophrenia Neg Hx   . Diabetes type II Neg Hx   . Thyroid disease Mother     Outpatient Encounter Prescriptions as of 09/26/2013  Medication Sig  . FLUoxetine (PROZAC) 10 MG capsule Take with 40 mg. Take one capsule for 14 days, then 2 capsules there after.  Marland Kitchen  FLUoxetine (PROZAC) 40 MG capsule Take 1 capsule (40 mg total) by mouth daily. Take 40 mg by mouth at bedtime.  . [DISCONTINUED] Dexlansoprazole 30 MG capsule Take 30 mg by mouth daily.       Objective:   Physical Exam  Constitutional: He is oriented to person, place, and time. He appears well-developed and well-nourished.  HENT:  Head: Normocephalic and atraumatic.  Genitourinary: Prostate normal. Rectal exam shows external hemorrhoid and internal hemorrhoid. Guaiac positive stool.     Neurological: He is alert and oriented to person, place, and time.  Skin: Skin is warm and dry.  Psychiatric: He has a normal mood and affect. His behavior is normal.          Assessment & Plan:  Blood in stool- unclear etiology. He does have an internal hemorrhoid external hemorrhoid which certainly could be the source but I did not see either lesion actively bleeding or oozing. The external hemorrhoids with a little bit inflamed. I did not see any fissures or  tears. No mass on rectal exam. Normal symmetric tone. Why it was positive. We'll refer to GI for further evaluation. I did recommend stool softeners to soften his bowel movements so that he does not have to strain. This should help area and make sure staying well hydrated with plenty of water. If he felt a large amount of bleeding then encouraged him to go to the emergency department immediately.

## 2013-09-26 NOTE — Patient Instructions (Signed)
Drink plenty of water. May want to start a stool softener twice a day as needed to help soften the stools so that you're not having to strain to pass them. This helps with hemorrhoids. It's stool softeners are not enough than you can choose something like MiraLax which is very gentle on the GI system.

## 2013-09-27 LAB — CBC WITH DIFFERENTIAL/PLATELET
Basophils Absolute: 0 10*3/uL (ref 0.0–0.1)
Basophils Relative: 0 % (ref 0–1)
Eosinophils Absolute: 0.1 10*3/uL (ref 0.0–0.7)
HCT: 41.3 % (ref 39.0–52.0)
MCH: 29.4 pg (ref 26.0–34.0)
MCHC: 34.4 g/dL (ref 30.0–36.0)
Monocytes Absolute: 0.4 10*3/uL (ref 0.1–1.0)
Monocytes Relative: 4 % (ref 3–12)
Neutrophils Relative %: 86 % — ABNORMAL HIGH (ref 43–77)
Platelets: 216 10*3/uL (ref 150–400)
RBC: 4.83 MIL/uL (ref 4.22–5.81)
RDW: 13 % (ref 11.5–15.5)
WBC: 8.7 10*3/uL (ref 4.0–10.5)

## 2013-09-28 ENCOUNTER — Ambulatory Visit (INDEPENDENT_AMBULATORY_CARE_PROVIDER_SITE_OTHER): Payer: Federal, State, Local not specified - PPO | Admitting: Psychiatry

## 2013-09-28 ENCOUNTER — Encounter (HOSPITAL_COMMUNITY): Payer: Self-pay | Admitting: Psychiatry

## 2013-09-28 VITALS — BP 94/65 | HR 61 | Ht 69.75 in | Wt 183.0 lb

## 2013-09-28 DIAGNOSIS — F4310 Post-traumatic stress disorder, unspecified: Secondary | ICD-10-CM

## 2013-09-28 DIAGNOSIS — F1910 Other psychoactive substance abuse, uncomplicated: Secondary | ICD-10-CM

## 2013-09-28 DIAGNOSIS — F1020 Alcohol dependence, uncomplicated: Secondary | ICD-10-CM

## 2013-09-28 MED ORDER — BUPROPION HCL 75 MG PO TABS: 75.0000 mg | ORAL_TABLET | Freq: Two times a day (BID) | ORAL | Status: DC

## 2013-09-28 MED ORDER — FLUOXETINE HCL 20 MG PO CAPS: 60.0000 mg | ORAL_CAPSULE | Freq: Every day | ORAL | Status: DC
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
## 2013-10-28 ENCOUNTER — Other Ambulatory Visit (HOSPITAL_COMMUNITY): Payer: Self-pay | Admitting: Psychiatry

## 2013-11-01 NOTE — Telephone Encounter (Signed)
Refilled medications

## 2013-11-04 ENCOUNTER — Ambulatory Visit (INDEPENDENT_AMBULATORY_CARE_PROVIDER_SITE_OTHER): Payer: Federal, State, Local not specified - PPO | Admitting: Psychiatry

## 2013-11-04 ENCOUNTER — Encounter (HOSPITAL_COMMUNITY): Payer: Self-pay | Admitting: Psychiatry

## 2013-11-04 VITALS — BP 110/65 | HR 68 | Wt 178.0 lb

## 2013-11-04 DIAGNOSIS — S069X9A Unspecified intracranial injury with loss of consciousness of unspecified duration, initial encounter: Secondary | ICD-10-CM | POA: Insufficient documentation

## 2013-11-04 DIAGNOSIS — F4310 Post-traumatic stress disorder, unspecified: Secondary | ICD-10-CM | POA: Insufficient documentation

## 2013-11-04 DIAGNOSIS — F1910 Other psychoactive substance abuse, uncomplicated: Secondary | ICD-10-CM

## 2013-11-04 DIAGNOSIS — S069X0S Unspecified intracranial injury without loss of consciousness, sequela: Secondary | ICD-10-CM

## 2013-11-04 MED ORDER — BUPROPION HCL 75 MG PO TABS: 75.0000 mg | ORAL_TABLET | Freq: Two times a day (BID) | ORAL | Status: DC

## 2013-11-04 MED ORDER — FLUOXETINE HCL 40 MG PO CAPS: 40.0000 mg | ORAL_CAPSULE | Freq: Every day | ORAL | Status: DC

## 2013-11-04 MED ORDER — FLUOXETINE HCL 20 MG PO CAPS: ORAL_CAPSULE | ORAL | Status: DC
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
## 2013-11-09 ENCOUNTER — Encounter: Payer: Self-pay | Admitting: Family Medicine

## 2013-11-28 ENCOUNTER — Telehealth: Payer: Self-pay | Admitting: *Deleted

## 2013-11-28 ENCOUNTER — Emergency Department
Admission: EM | Admit: 2013-11-28 | Discharge: 2013-11-28 | Disposition: A | Discharge status: Home / Self Care | Payer: Federal, State, Local not specified - PPO | Source: Home / Self Care | Attending: Family Medicine | Admitting: Family Medicine

## 2013-11-28 ENCOUNTER — Encounter: Payer: Self-pay | Admitting: Emergency Medicine

## 2013-11-28 DIAGNOSIS — K1120 Sialoadenitis, unspecified: Secondary | ICD-10-CM

## 2013-11-28 DIAGNOSIS — K1121 Acute sialoadenitis: Secondary | ICD-10-CM

## 2013-11-28 LAB — POCT CBC W AUTO DIFF (K'VILLE URGENT CARE)

## 2013-11-28 MED ORDER — HYDROCODONE-ACETAMINOPHEN 5-325 MG PO TABS: ORAL_TABLET | ORAL | Status: DC

## 2013-11-28 MED ORDER — AMOXICILLIN-POT CLAVULANATE 875-125 MG PO TABS: 1.0000 | ORAL_TABLET | Freq: Two times a day (BID) | ORAL | Status: DC

## 2013-11-28 MED ORDER — MELOXICAM 15 MG PO TABS: 15.0000 mg | ORAL_TABLET | Freq: Every day | ORAL | Status: DC

## 2013-11-28 NOTE — Discharge Instructions (Signed)
Apply warm compress two to three times daily.   Parotitis Parotitis is soreness and swelling (inflammation) of one or both parotid glands. The parotid glands produce saliva. They are located on each side of the face, below and in front of the earlobes. The saliva produced comes out of tiny openings (ducts) inside the cheeks. In most cases, parotitis goes away over time or with treatment. If your parotitis is caused by certain long-term (chronic) diseases, it may come back again.  CAUSES  Parotitis can be caused by:  Viral infections. Mumps is one viral infection that can cause parotitis.  Bacterial infections.  Blockage of the salivary ducts due to a salivary stone.  Narrowing of the salivary ducts.  Swelling of the salivary ducts.  Dehydration.  Autoimmune conditions, such as sarcoidosis or Sjogren's syndrome.  Air from activities such as scuba diving, glass blowing, or playing an instrument (rare).  Human immunodeficiency virus (HIV) or acquired immunodeficiency syndrome (AIDS).  Tuberculosis. SYMPTOMS   The ears may appear to be pushed up and out from their normal position.  Redness (erythema) of the skin over the parotid glands.  Pain and tenderness over the parotid glands.  Swelling in the parotid gland area.  Yellowish-white fluid (pus) coming from the ducts inside the cheeks.  Dry mouth.  Bad taste in the mouth. DIAGNOSIS  Your caregiver may determine that you have parotitis based on your symptoms and a physical exam. A sample of fluid may also be taken from the parotid gland and tested to find the cause of your infection. X-rays or computed tomography (CT) scans may be taken if your caregiver thinks you might have a salivary stone blocking your salivary duct. TREATMENT  Treatment varies depending upon the cause of your parotitis. If your parotitis is caused by mumps, no treatment is needed. The condition will go away on its own after 7 to 10 days. In other cases,  treatment may include:  Antibiotics if your infection was caused by bacteria.  Pain medicines.  Gland massage.  Eating sour candy to increase your saliva production.  Removal of salivary stones. Your caregiver may flush stones out with fluids or remove them with tweezers.  Surgery to remove the parotid glands. HOME CARE INSTRUCTIONS   If you were given antibiotics, take them as directed. Finish them even if you start to feel better.  Put warm compresses on the sore area.  Only take over-the-counter or prescription medicines for pain, discomfort, or fever as directed by your caregiver.  Drink enough fluids to keep your urine clear or pale yellow. SEEK IMMEDIATE MEDICAL CARE IF:   You have increasing pain or swelling that is not controlled with medicine.  You have a fever. MAKE SURE YOU:  Understand these instructions.  Will watch your condition.  Will get help right away if you are not doing well or get worse. Document Released: 04/25/2002 Document Revised: 01/26/2012 Document Reviewed: 09/29/2011 [facility] Patient Information 2014 [place], [place].
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
## 2013-11-28 NOTE — ED Notes (Signed)
Charles Crosby c/o right sided jaw pain x 2-3 days. Pain is constant. For 1 month he reports "fatigue" in his jaw when chewing.

## 2013-11-29 ENCOUNTER — Ambulatory Visit: Payer: Self-pay | Admitting: Family Medicine

## 2013-11-29 LAB — AMYLASE: Amylase: 78 U/L (ref 0–105)

## 2013-12-19 ENCOUNTER — Ambulatory Visit (INDEPENDENT_AMBULATORY_CARE_PROVIDER_SITE_OTHER): Payer: Federal, State, Local not specified - PPO | Admitting: Psychiatry

## 2013-12-19 DIAGNOSIS — F4310 Post-traumatic stress disorder, unspecified: Secondary | ICD-10-CM
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
## 2013-12-23 ENCOUNTER — Ambulatory Visit (INDEPENDENT_AMBULATORY_CARE_PROVIDER_SITE_OTHER): Payer: Federal, State, Local not specified - PPO | Admitting: Psychiatry

## 2013-12-23 ENCOUNTER — Encounter (HOSPITAL_COMMUNITY): Payer: Self-pay | Admitting: Psychiatry

## 2013-12-23 VITALS — BP 108/74 | HR 72 | Wt 182.0 lb

## 2013-12-23 DIAGNOSIS — F1910 Other psychoactive substance abuse, uncomplicated: Secondary | ICD-10-CM

## 2013-12-23 DIAGNOSIS — F4310 Post-traumatic stress disorder, unspecified: Secondary | ICD-10-CM

## 2013-12-23 MED ORDER — DIVALPROEX SODIUM ER 250 MG PO TB24: ORAL_TABLET | ORAL | Status: DC

## 2013-12-23 MED ORDER — FLUOXETINE HCL 10 MG PO CAPS: 10.0000 mg | ORAL_CAPSULE | Freq: Every day | ORAL | Status: DC

## 2013-12-23 MED ORDER — FLUOXETINE HCL 40 MG PO CAPS: 40.0000 mg | ORAL_CAPSULE | Freq: Every day | ORAL | Status: DC

## 2013-12-23 MED ORDER — DIVALPROEX SODIUM ER 250 MG PO TB24: 250.0000 mg | ORAL_TABLET | Freq: Every day | ORAL | Status: DC
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
## 2014-01-06 ENCOUNTER — Ambulatory Visit (HOSPITAL_COMMUNITY): Payer: Self-pay | Admitting: Psychiatry

## 2014-01-20 ENCOUNTER — Ambulatory Visit (HOSPITAL_COMMUNITY): Payer: Self-pay | Admitting: Psychiatry

## 2014-02-02 ENCOUNTER — Ambulatory Visit (HOSPITAL_COMMUNITY): Payer: Self-pay | Admitting: Psychiatry

## 2014-02-03 ENCOUNTER — Ambulatory Visit (HOSPITAL_COMMUNITY): Payer: Self-pay | Admitting: Psychiatry

## 2014-02-03 ENCOUNTER — Ambulatory Visit (INDEPENDENT_AMBULATORY_CARE_PROVIDER_SITE_OTHER): Payer: Federal, State, Local not specified - PPO | Admitting: Psychiatry

## 2014-02-03 ENCOUNTER — Encounter (HOSPITAL_COMMUNITY): Payer: Self-pay | Admitting: Psychiatry

## 2014-02-03 ENCOUNTER — Telehealth (HOSPITAL_COMMUNITY): Payer: Self-pay

## 2014-02-03 VITALS — BP 118/79 | HR 75 | Wt 186.0 lb

## 2014-02-03 DIAGNOSIS — F4310 Post-traumatic stress disorder, unspecified: Secondary | ICD-10-CM

## 2014-02-03 DIAGNOSIS — F1910 Other psychoactive substance abuse, uncomplicated: Secondary | ICD-10-CM

## 2014-02-03 MED ORDER — DIVALPROEX SODIUM ER 250 MG PO TB24: 1250.0000 mg | ORAL_TABLET | Freq: Every day | ORAL | Status: DC

## 2014-02-03 MED ORDER — FLUOXETINE HCL 40 MG PO CAPS: 800.0000 mg | ORAL_CAPSULE | Freq: Every day | ORAL | Status: DC

## 2014-02-03 MED ORDER — FLUOXETINE HCL 40 MG PO CAPS: 40.0000 mg | ORAL_CAPSULE | Freq: Every day | ORAL | Status: DC

## 2014-02-03 MED ORDER — BUPROPION HCL 75 MG PO TABS
75.0000 mg | ORAL_TABLET | Freq: Two times a day (BID) | ORAL | Status: DC
Start: 2014-02-03 — End: 2014-05-26

## 2014-02-03 NOTE — Telephone Encounter (Signed)
Script Clarified.
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
## 2014-02-14 ENCOUNTER — Telehealth (HOSPITAL_COMMUNITY): Payer: Self-pay

## 2014-02-14 DIAGNOSIS — F4310 Post-traumatic stress disorder, unspecified: Secondary | ICD-10-CM

## 2014-02-14 LAB — HEPATIC FUNCTION PANEL
ALK PHOS: 45 U/L (ref 39–117)
ALT: 18 U/L (ref 0–53)
AST: 24 U/L (ref 0–37)
Albumin: 4.2 g/dL (ref 3.5–5.2)
BILIRUBIN INDIRECT: 0.3 mg/dL (ref 0.2–1.2)
Bilirubin, Direct: 0.1 mg/dL (ref 0.0–0.3)
TOTAL PROTEIN: 6.8 g/dL (ref 6.0–8.3)
Total Bilirubin: 0.4 mg/dL (ref 0.2–1.2)

## 2014-02-14 LAB — CBC WITH DIFFERENTIAL/PLATELET
BASOS PCT: 1 % (ref 0–1)
Basophils Absolute: 0.1 10*3/uL (ref 0.0–0.1)
EOS ABS: 0.2 10*3/uL (ref 0.0–0.7)
Eosinophils Relative: 3 % (ref 0–5)
HCT: 42.4 % (ref 39.0–52.0)
Hemoglobin: 14.4 g/dL (ref 13.0–17.0)
Lymphocytes Relative: 23 % (ref 12–46)
Lymphs Abs: 1.5 10*3/uL (ref 0.7–4.0)
MCH: 29.9 pg (ref 26.0–34.0)
MCHC: 34 g/dL (ref 30.0–36.0)
MCV: 88 fL (ref 78.0–100.0)
Monocytes Absolute: 0.4 10*3/uL (ref 0.1–1.0)
Monocytes Relative: 6 % (ref 3–12)
NEUTROS PCT: 67 % (ref 43–77)
Neutro Abs: 4.3 10*3/uL (ref 1.7–7.7)
PLATELETS: 240 10*3/uL (ref 150–400)
RBC: 4.82 MIL/uL (ref 4.22–5.81)
RDW: 12.9 % (ref 11.5–15.5)
WBC: 6.4 10*3/uL (ref 4.0–10.5)

## 2014-02-14 LAB — VALPROIC ACID LEVEL: Valproic Acid Lvl: 90.5 ug/mL (ref 50.0–100.0)

## 2014-02-14 MED ORDER — FLUOXETINE HCL 40 MG PO CAPS: 80.0000 mg | ORAL_CAPSULE | Freq: Every day | ORAL | Status: DC

## 2014-02-14 NOTE — Telephone Encounter (Signed)
Will decrease dose of wellbutrin to 75 mg daily due to continued anxiety.

## 2014-03-03 ENCOUNTER — Ambulatory Visit (HOSPITAL_COMMUNITY): Payer: Self-pay | Admitting: Psychiatry

## 2014-03-17 ENCOUNTER — Ambulatory Visit (HOSPITAL_COMMUNITY): Payer: Self-pay | Admitting: Psychiatry

## 2014-03-31 ENCOUNTER — Ambulatory Visit (HOSPITAL_COMMUNITY): Payer: Self-pay | Admitting: Psychiatry

## 2014-04-02 ENCOUNTER — Other Ambulatory Visit (HOSPITAL_COMMUNITY): Payer: Self-pay | Admitting: Psychiatry

## 2014-04-03 ENCOUNTER — Ambulatory Visit (INDEPENDENT_AMBULATORY_CARE_PROVIDER_SITE_OTHER): Payer: Federal, State, Local not specified - PPO | Admitting: Psychiatry

## 2014-04-03 ENCOUNTER — Encounter: Payer: Self-pay | Admitting: Family Medicine

## 2014-04-03 DIAGNOSIS — F4310 Post-traumatic stress disorder, unspecified: Secondary | ICD-10-CM

## 2014-04-03 NOTE — Progress Notes (Signed)
   THERAPIST PROGRESS NOTE  Session Time: 10:00-10:50 am  Participation Level: Active  Behavioral Response: CasualAlertAnxious  Type of Therapy: Individual Therapy  Treatment Goals addressed: Anxiety  Interventions: Biofeedback and Supportive  Summary: STANISLAUS KALTENBACH is a 38 y.o. male who presents with severe anxious mood and affect. This is Pts second session with Probation officer and has not been in for session since February due to "being busy with work". Pt said his work has decided to put him on retirement with full military benefits but he is unsure when that will be effective. In the meantime he continues to work and drive to [place] daily for his job. Pt said his aggression and anger have reduced significantly after Dr. Josephine Igo added Depakote to his medication regimen. Pt said his anxiety is the main stressor currently with feelings of panic and nausea symptoms.  Pt agreed to learn and practice heartmath to manage anxiety during the session and lowered his anxiety from a 6 to a 3 before the end of the session. Pt said he was surprised at how effective it was and what releif it gave him. He agreed to use it daily three times until he sees Dr. Josephine Igo and gets in with a counselor in his office this month.   Suicidal/Homicidal: No   Therapist Response: Assessed overall level of functioning per Pt self report and through the biofeedback program. Educated Pt on stress and how to use biofeedback to reduce stress in the body.   Plan: Return to Dr. Josephine Igo and a counselor in his office with [facility]. Suspend counseling services with Probation officer and discharge Pt.   Diagnosis: Axis I: PTSD      Haruka Kowaleski E, LCSW 04/03/2014

## 2014-04-14 ENCOUNTER — Ambulatory Visit (HOSPITAL_COMMUNITY): Payer: Self-pay | Admitting: Psychiatry

## 2014-04-17 ENCOUNTER — Ambulatory Visit (HOSPITAL_COMMUNITY): Payer: Self-pay | Admitting: Psychiatry

## 2014-04-21 ENCOUNTER — Ambulatory Visit (HOSPITAL_COMMUNITY): Payer: Self-pay | Admitting: Psychiatry

## 2014-05-26 ENCOUNTER — Ambulatory Visit (INDEPENDENT_AMBULATORY_CARE_PROVIDER_SITE_OTHER): Payer: Federal, State, Local not specified - PPO | Admitting: Psychiatry

## 2014-05-26 ENCOUNTER — Encounter (HOSPITAL_COMMUNITY): Payer: Self-pay | Admitting: Psychiatry

## 2014-05-26 VITALS — Ht 71.0 in | Wt 175.0 lb

## 2014-05-26 DIAGNOSIS — F4310 Post-traumatic stress disorder, unspecified: Secondary | ICD-10-CM

## 2014-05-26 MED ORDER — FLUOXETINE HCL 40 MG PO CAPS: 80.0000 mg | ORAL_CAPSULE | Freq: Every day | ORAL | Status: DC

## 2014-05-26 MED ORDER — BUPROPION HCL 75 MG PO TABS: 75.0000 mg | ORAL_TABLET | Freq: Every morning | ORAL | Status: DC

## 2014-05-26 MED ORDER — DIVALPROEX SODIUM ER 250 MG PO TB24: 1250.0000 mg | ORAL_TABLET | Freq: Every day | ORAL | Status: DC
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
## 2014-08-04 ENCOUNTER — Encounter: Payer: Self-pay | Admitting: Family Medicine

## 2014-08-04 ENCOUNTER — Ambulatory Visit (INDEPENDENT_AMBULATORY_CARE_PROVIDER_SITE_OTHER): Payer: Federal, State, Local not specified - PPO | Admitting: Family Medicine

## 2014-08-04 VITALS — BP 120/73 | HR 76 | Ht 71.0 in | Wt 203.0 lb

## 2014-08-04 DIAGNOSIS — Z13220 Encounter for screening for lipoid disorders: Secondary | ICD-10-CM

## 2014-08-04 DIAGNOSIS — G8929 Other chronic pain: Secondary | ICD-10-CM

## 2014-08-04 DIAGNOSIS — Z23 Encounter for immunization: Secondary | ICD-10-CM | POA: Diagnosis not present

## 2014-08-04 DIAGNOSIS — R519 Headache, unspecified: Secondary | ICD-10-CM

## 2014-08-04 DIAGNOSIS — R51 Headache: Secondary | ICD-10-CM

## 2014-08-04 DIAGNOSIS — R6884 Jaw pain: Secondary | ICD-10-CM

## 2014-08-04 MED ORDER — METOPROLOL SUCCINATE ER 25 MG PO TB24: 25.0000 mg | ORAL_TABLET | Freq: Every day | ORAL | Status: DC

## 2014-08-04 NOTE — Patient Instructions (Signed)
Temporomandibular Problems  Temporomandibular joint (TMJ) dysfunction means there are problems with the joint between your jaw and your skull. This is a joint lined by cartilage like other joints in your body but also has a small disc in the joint which keeps the bones from rubbing on each other. These joints are like other joints and can get inflamed (sore) from arthritis and other problems. When this joint gets sore, it can cause headaches and pain in the jaw and the face. CAUSES  Usually the arthritic types of problems are caused by soreness in the joint. Soreness in the joint can also be caused by overuse. This may come from grinding your teeth. It may also come from mis-alignment in the joint. DIAGNOSIS Diagnosis of this condition can often be made by history and exam. Sometimes your caregiver may need X-rays or an MRI scan to determine the exact cause. It may be necessary to see your dentist to determine if your teeth and jaws are lined up correctly. TREATMENT  Most of the time this problem is not serious; however, sometimes it can persist (become chronic). When this happens medications that will cut down on inflammation (soreness) help. Sometimes a shot of cortisone into the joint will be helpful. If your teeth are not aligned it may help for your dentist to make a splint for your mouth that can help this problem. If no physical problems can be found, the problem may come from tension. If tension is found to be the cause, biofeedback or relaxation techniques may be helpful. HOME CARE INSTRUCTIONS   Later in the day, applications of ice packs may be helpful. Ice can be used in a plastic bag with a towel around it to prevent frostbite to skin. This may be used about every 2 hours for 20 to 30 minutes, as needed while awake, or as directed by your caregiver.  Only take over-the-counter or prescription medicines for pain, discomfort, or fever as directed by your caregiver.  If physical therapy was  prescribed, follow your caregiver's directions.  Wear mouth appliances as directed if they were given. Document Released: 07/29/2001 Document Revised: 01/26/2012 Document Reviewed: 11/05/2008 ExitCare Patient Information 2015 [employer]. This information is not intended to replace advice given to you by your health care provider. Make sure you discuss any questions you have with your health care provider.  

## 2014-08-04 NOTE — Progress Notes (Signed)
   Subjective:    Patient ID: Charles Crosby, male    DOB: [birth year], 38 y.o.   MRN: [id number]  Headache    Having recurrent HA.  No vision changes.  History of traumatic brain injury. HA are almost daily.  Tend to start in the afternoon. Describes as mild. Doesn 't have to leave work for it.  Tylenol helps some.  Noise aggreavtes it.  Last week did have one in the AM.  He is a Games developer for a living.  No light sensitivity.  No nausea with the HA.  Occ feels dizzy with it.  HA is usually frontal .  No throbbing.  + dull ache.  NO URI or allergy sxs.    Still having bila jaw pain. Says really more soreness. No jaw locking or popping. He was seen in urgent care initially and diagnosed with an enlarged parotid gland. He was then seen by ENT. He's not really complaining of pain below his jaw but mostly over the TMJ joints bilaterally. No ear pain. He describes it as a most a muscle fatigue. He says it feels like the muscles in his jaw gets tired.   Review of Systems  Neurological: Positive for headaches.       Objective:   Physical Exam  Constitutional: He is oriented to person, place, and time. He appears well-developed and well-nourished.  HENT:  Head: Normocephalic and atraumatic.  Right Ear: External ear normal.  Left Ear: External ear normal.  Nose: Nose normal.  Mouth/Throat: Oropharynx is clear and moist.  TMs and canals are clear.   Eyes: Conjunctivae and EOM are normal. Pupils are equal, round, and reactive to light.  Neck: Neck supple. No thyromegaly present.  Cardiovascular: Normal rate and normal heart sounds.   Pulmonary/Chest: Effort normal and breath sounds normal.  Musculoskeletal:  No popping clicking or locking of the jaw joint. He is able to open and close normally without any shifting or dislocation of the joint. Nontender over the TMJ.  Lymphadenopathy:    He has no cervical adenopathy.  Neurological: He is alert and oriented to person, place, and time.  Skin:  Skin is warm and dry.  Psychiatric: He has a normal mood and affect.          Assessment & Plan:  Frequent HA. Normal vision. Chronic tension type HA.  Not a candidate for amitryptiline since on high dose of prozac.  Not candidate for gabapentin since already on depakote. Will add low dose BB for trial of 6 week and see if helps.  Doesn't quite meet criteria for migraines, but does seem to be consistent with chronic daily headache. If not improving on the beta blocker then consider alternative regimen.  Bilat jaw pain - based on his description his pain is more consistent with TMJ or possible jaw muscle fatigue. The specimen avoiding excessive chewing such as chewing gum, tough meats et Ronney Asters. Also trying not to clench his jaw during the daytime. He may then want to talk with his wife and see if he is grinding his teeth at night. They're certainly this can contribute as well. Additional information provided with a handout. I do not see anything worrisome on exam.

## 2014-08-08 LAB — COMPLETE METABOLIC PANEL WITH GFR
ALK PHOS: 37 U/L — AB (ref 39–117)
ALT: 20 U/L (ref 0–53)
AST: 20 U/L (ref 0–37)
Albumin: 4 g/dL (ref 3.5–5.2)
BILIRUBIN TOTAL: 0.3 mg/dL (ref 0.2–1.2)
BUN: 18 mg/dL (ref 6–23)
CO2: 30 mEq/L (ref 19–32)
CREATININE: 1.13 mg/dL (ref 0.50–1.35)
Calcium: 9.2 mg/dL (ref 8.4–10.5)
Chloride: 102 mEq/L (ref 96–112)
GFR, Est African American: >89 mL/min
GFR, Est Non African American: 82 mL/min
Glucose, Bld: 91 mg/dL (ref 70–99)
Potassium: 4.9 mEq/L (ref 3.5–5.3)
SODIUM: 139 meq/L (ref 135–145)
TOTAL PROTEIN: 6.5 g/dL (ref 6.0–8.3)

## 2014-08-08 LAB — LIPID PANEL
Cholesterol: 144 mg/dL (ref 0–200)
HDL: 33 mg/dL — ABNORMAL LOW
LDL Cholesterol: 96 mg/dL (ref 0–99)
Total CHOL/HDL Ratio: 4.4 ratio
Triglycerides: 77 mg/dL
VLDL: 15 mg/dL (ref 0–40)

## 2014-09-01 ENCOUNTER — Ambulatory Visit (INDEPENDENT_AMBULATORY_CARE_PROVIDER_SITE_OTHER): Payer: Federal, State, Local not specified - PPO | Admitting: Family Medicine

## 2014-09-01 ENCOUNTER — Encounter: Payer: Self-pay | Admitting: Family Medicine

## 2014-09-01 VITALS — BP 102/63 | HR 69 | Wt 205.0 lb

## 2014-09-01 DIAGNOSIS — R519 Headache, unspecified: Secondary | ICD-10-CM

## 2014-09-01 DIAGNOSIS — R51 Headache: Secondary | ICD-10-CM | POA: Diagnosis not present

## 2014-09-01 MED ORDER — METOPROLOL SUCCINATE ER 25 MG PO TB24: 25.0000 mg | ORAL_TABLET | Freq: Every day | ORAL | Status: DC

## 2014-09-01 NOTE — Progress Notes (Signed)
   Subjective:    Patient ID: Charles Crosby, male    DOB: 1976-08-24, 38 y.o.   MRN: [id number]  HPI  Headaches - headaches, doing well on metoprolol and has not has to take as much tylenol for his headaches. Now getting about about 1-2 per week, they wree daily. Using tyleonl some and helps when needs it. Denies feeling lightheaded does occ feel dizzy.   Review of Systems     Objective:   Physical Exam  Constitutional: He is oriented to person, place, and time. He appears well-developed and well-nourished.  HENT:  Head: Normocephalic and atraumatic.  Cardiovascular: Normal rate, regular rhythm and normal heart sounds.   Pulmonary/Chest: Effort normal and breath sounds normal.  Neurological: He is alert and oriented to person, place, and time.  Skin: Skin is warm and dry.  Psychiatric: He has a normal mood and affect. His behavior is normal.          Assessment & Plan:  Headaches - much improved. He has had about 80% reduction in headache frequency although it is symmetric all. He has had a few episodes of dizziness. It certainly could be related to the beta blockers his blood pressures on the low and of normal. There could be from some of his other psychiatric medications as well. Continue to monitor. If it becomes more frequent or problems some or happens while driving and please let me know. Otherwise I think at this point we're getting a great result from the medication itself. Encouraged him to stay with it for about 6 months and at that point she's doing well we will try to wean the medication

## 2014-10-06 LAB — LIPID PANEL
Cholesterol: 163 mg/dL (ref 0–200)
HDL: 38 mg/dL (ref 35–70)
LDL Cholesterol: 98 mg/dL
Triglycerides: 136 mg/dL (ref 40–160)

## 2014-10-06 LAB — CMP14+LP+1AC+CBC/D/PLT+TSH
ALBUMIN: 3.8
CALCIUM: 8.6 mg/dL
CHLORIDE: 102 mmol/L

## 2014-10-06 LAB — CBC AND DIFFERENTIAL
Hemoglobin: 14 g/dL (ref 13.5–17.5)
PLATELETS: 241 10*3/uL (ref 150–399)
WBC: 8.6 10^3/mL

## 2014-10-06 LAB — BASIC METABOLIC PANEL
CREATININE: 1.2 mg/dL (ref 0.6–1.3)
Glucose: 92 mg/dL
Potassium: 4.3 mmol/L (ref 3.4–5.3)
Sodium: 136 mmol/L — AB (ref 137–147)

## 2014-10-06 LAB — HEPATIC FUNCTION PANEL
ALK PHOS: 46 U/L (ref 25–125)
ALT: 40 U/L (ref 10–40)
AST: 30 U/L (ref 14–40)

## 2014-10-06 LAB — HEMOGLOBIN A1C: HEMOGLOBIN A1C: 5.5 % (ref 4.0–6.0)

## 2014-10-23 ENCOUNTER — Ambulatory Visit (INDEPENDENT_AMBULATORY_CARE_PROVIDER_SITE_OTHER): Payer: Federal, State, Local not specified - PPO | Admitting: Family Medicine

## 2014-10-23 ENCOUNTER — Ambulatory Visit (INDEPENDENT_AMBULATORY_CARE_PROVIDER_SITE_OTHER): Payer: Federal, State, Local not specified - PPO

## 2014-10-23 ENCOUNTER — Encounter: Payer: Self-pay | Admitting: Family Medicine

## 2014-10-23 VITALS — BP 103/61 | HR 75 | Temp 98.0°F | Ht 71.0 in | Wt 209.0 lb

## 2014-10-23 DIAGNOSIS — M545 Low back pain, unspecified: Secondary | ICD-10-CM

## 2014-10-23 DIAGNOSIS — M546 Pain in thoracic spine: Secondary | ICD-10-CM

## 2014-10-23 MED ORDER — CYCLOBENZAPRINE HCL 10 MG PO TABS: 10.0000 mg | ORAL_TABLET | Freq: Every evening | ORAL | Status: DC | PRN

## 2014-10-23 MED ORDER — TRAMADOL HCL 50 MG PO TABS: 50.0000 mg | ORAL_TABLET | Freq: Two times a day (BID) | ORAL | Status: DC | PRN

## 2014-10-23 NOTE — Progress Notes (Signed)
Quick Note:  X-ray of mid back looks ok ______
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
## 2014-10-24 ENCOUNTER — Encounter: Payer: Self-pay | Admitting: *Deleted

## 2014-10-24 ENCOUNTER — Emergency Department (INDEPENDENT_AMBULATORY_CARE_PROVIDER_SITE_OTHER)
Admission: EM | Admit: 2014-10-24 | Discharge: 2014-10-24 | Disposition: A | Discharge status: Home / Self Care | Payer: Federal, State, Local not specified - PPO | Source: Home / Self Care | Attending: Physician Assistant | Admitting: Physician Assistant

## 2014-10-24 DIAGNOSIS — S3991XA Unspecified injury of abdomen, initial encounter: Secondary | ICD-10-CM

## 2014-10-24 LAB — POCT URINALYSIS DIP (MANUAL ENTRY)
BILIRUBIN UA: NEGATIVE
Bilirubin, UA: NEGATIVE
Glucose, UA: NEGATIVE
Nitrite, UA: NEGATIVE
PROTEIN UA: NEGATIVE
RBC UA: NEGATIVE
Spec Grav, UA: 1.02 (ref 1.005–1.03)
Urobilinogen, UA: 0.2 (ref 0–1)
pH, UA: 6 (ref 5–8)

## 2014-10-24 MED ORDER — HYDROCODONE-ACETAMINOPHEN 5-325 MG PO TABS: 1.0000 | ORAL_TABLET | Freq: Three times a day (TID) | ORAL | Status: DC | PRN

## 2014-10-24 MED ORDER — IBUPROFEN 800 MG PO TABS: 800.0000 mg | ORAL_TABLET | Freq: Three times a day (TID) | ORAL | Status: DC | PRN

## 2014-10-24 NOTE — Discharge Instructions (Signed)
°  Blunt Abdominal Trauma °A blunt injury to the abdomen can cause pain. The pain is most likely from bruising and stretching of your muscles. This pain is often made worse with movement. Most often these injuries are not serious and get better within 1 week with rest and mild pain medicine. However, internal organs (liver, spleen, kidneys) can be injured with blunt trauma. If you do not get better or if you get worse, further examination may be needed. °Continue with your regular daily activities, but avoid any strenuous activities until your pain is improved. If your stomach is upset, stick to a clear liquid diet and slowly advance to solid food.  °SEEK IMMEDIATE MEDICAL CARE IF:  °· You develop increasing pain, nausea, or repeated vomiting. °· You develop chest pain or breathing difficulty. °· You develop blood in the urine, vomit, or stool. °· You develop weakness, fainting, fever, or other serious complaints. °Document Released: 12/11/2004 Document Revised: 01/26/2012 Document Reviewed: 03/29/2009 °ExitCare® Patient Information ©2015 [employer]. This information is not intended to replace advice given to you by your health care provider. Make sure you discuss any questions you have with your health care provider. ° °
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
## 2014-10-26 DIAGNOSIS — G3184 Mild cognitive impairment, so stated: Secondary | ICD-10-CM | POA: Insufficient documentation

## 2014-10-28 ENCOUNTER — Encounter: Payer: Self-pay | Admitting: *Deleted

## 2014-10-28 ENCOUNTER — Emergency Department (INDEPENDENT_AMBULATORY_CARE_PROVIDER_SITE_OTHER)
Admission: EM | Admit: 2014-10-28 | Discharge: 2014-10-28 | Disposition: A | Discharge status: Home / Self Care | Payer: Federal, State, Local not specified - PPO | Source: Home / Self Care | Attending: Emergency Medicine | Admitting: Emergency Medicine

## 2014-10-28 DIAGNOSIS — S301XXA Contusion of abdominal wall, initial encounter: Secondary | ICD-10-CM
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
## 2014-10-28 NOTE — Discharge Instructions (Signed)

## 2014-10-30 ENCOUNTER — Encounter: Payer: Self-pay | Admitting: Family Medicine

## 2014-10-31 ENCOUNTER — Ambulatory Visit (INDEPENDENT_AMBULATORY_CARE_PROVIDER_SITE_OTHER): Payer: Federal, State, Local not specified - PPO | Admitting: Physician Assistant

## 2014-10-31 ENCOUNTER — Encounter: Payer: Self-pay | Admitting: Physician Assistant

## 2014-10-31 ENCOUNTER — Ambulatory Visit (INDEPENDENT_AMBULATORY_CARE_PROVIDER_SITE_OTHER): Payer: Federal, State, Local not specified - PPO

## 2014-10-31 VITALS — BP 93/58 | HR 77 | Ht 71.0 in | Wt 209.0 lb

## 2014-10-31 DIAGNOSIS — S3991XD Unspecified injury of abdomen, subsequent encounter: Secondary | ICD-10-CM

## 2014-10-31 DIAGNOSIS — S301XXD Contusion of abdominal wall, subsequent encounter: Secondary | ICD-10-CM

## 2014-10-31 DIAGNOSIS — S8011XA Contusion of right lower leg, initial encounter: Secondary | ICD-10-CM

## 2014-10-31 DIAGNOSIS — W208XXA Other cause of strike by thrown, projected or falling object, initial encounter: Secondary | ICD-10-CM

## 2014-10-31 MED ORDER — HYDROCODONE-ACETAMINOPHEN 5-325 MG PO TABS: 1.0000 | ORAL_TABLET | Freq: Three times a day (TID) | ORAL | Status: DC | PRN

## 2014-10-31 NOTE — Patient Instructions (Signed)

## 2014-11-01 DIAGNOSIS — S3991XA Unspecified injury of abdomen, initial encounter: Secondary | ICD-10-CM | POA: Insufficient documentation

## 2014-11-01 DIAGNOSIS — S301XXA Contusion of abdominal wall, initial encounter: Secondary | ICD-10-CM | POA: Insufficient documentation
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
## 2014-12-05 ENCOUNTER — Ambulatory Visit: Payer: Self-pay | Admitting: Family Medicine

## 2014-12-30 ENCOUNTER — Emergency Department (INDEPENDENT_AMBULATORY_CARE_PROVIDER_SITE_OTHER)
Admission: EM | Admit: 2014-12-30 | Discharge: 2014-12-30 | Disposition: A | Discharge status: Home / Self Care | Payer: Federal, State, Local not specified - PPO | Source: Home / Self Care | Attending: Family Medicine | Admitting: Family Medicine

## 2014-12-30 ENCOUNTER — Ambulatory Visit (HOSPITAL_BASED_OUTPATIENT_CLINIC_OR_DEPARTMENT_OTHER)
Admit: 2014-12-30 | Discharge: 2014-12-30 | Disposition: A | Discharge status: Home / Self Care | Payer: Federal, State, Local not specified - PPO | Source: Ambulatory Visit | Attending: Family Medicine | Admitting: Family Medicine

## 2014-12-30 DIAGNOSIS — N508 Other specified disorders of male genital organs: Secondary | ICD-10-CM | POA: Insufficient documentation

## 2014-12-30 DIAGNOSIS — R52 Pain, unspecified: Secondary | ICD-10-CM

## 2014-12-30 DIAGNOSIS — N50812 Left testicular pain: Secondary | ICD-10-CM

## 2014-12-30 LAB — POCT URINALYSIS DIP (MANUAL ENTRY)
Bilirubin, UA: NEGATIVE
Blood, UA: NEGATIVE
GLUCOSE UA: NEGATIVE
Leukocytes, UA: NEGATIVE
Nitrite, UA: NEGATIVE
SPEC GRAV UA: 1.025
UROBILINOGEN UA: 1
pH, UA: 7

## 2014-12-30 MED ORDER — CIPROFLOXACIN HCL 500 MG PO TABS: 500.0000 mg | ORAL_TABLET | Freq: Two times a day (BID) | ORAL | Status: DC

## 2014-12-30 NOTE — Discharge Instructions (Signed)
May take Ibuprofen 200mg, 4 tabs every 8 hours with food.   If symptoms become significantly worse during the night or over the weekend, proceed to the local emergency room.  
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
## 2014-12-30 NOTE — ED Notes (Signed)
Patient c/o testicular pain that started yesterday and is getting worse. States that he has no other symptoms.

## 2015-01-02 ENCOUNTER — Telehealth: Payer: Self-pay | Admitting: *Deleted

## 2015-01-29 ENCOUNTER — Encounter: Payer: Self-pay | Admitting: Family Medicine

## 2015-02-21 ENCOUNTER — Encounter: Payer: Self-pay | Admitting: Family Medicine

## 2015-02-28 ENCOUNTER — Ambulatory Visit (INDEPENDENT_AMBULATORY_CARE_PROVIDER_SITE_OTHER): Payer: Federal, State, Local not specified - PPO | Admitting: Family Medicine

## 2015-02-28 ENCOUNTER — Encounter: Payer: Self-pay | Admitting: Family Medicine

## 2015-02-28 VITALS — BP 90/56 | HR 96 | Ht 72.0 in | Wt 210.0 lb

## 2015-02-28 DIAGNOSIS — Z8489 Family history of other specified conditions: Secondary | ICD-10-CM

## 2015-02-28 DIAGNOSIS — R0683 Snoring: Secondary | ICD-10-CM

## 2015-02-28 DIAGNOSIS — R519 Headache, unspecified: Secondary | ICD-10-CM

## 2015-02-28 DIAGNOSIS — R51 Headache: Secondary | ICD-10-CM | POA: Diagnosis not present

## 2015-02-28 DIAGNOSIS — Z820 Family history of epilepsy and other diseases of the nervous system: Secondary | ICD-10-CM

## 2015-02-28 MED ORDER — PROPRANOLOL HCL 20 MG PO TABS: 20.0000 mg | ORAL_TABLET | Freq: Two times a day (BID) | ORAL | Status: DC
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
## 2015-03-12 ENCOUNTER — Ambulatory Visit (HOSPITAL_BASED_OUTPATIENT_CLINIC_OR_DEPARTMENT_OTHER): Payer: Federal, State, Local not specified - PPO | Attending: Family Medicine | Admitting: Radiology

## 2015-03-12 DIAGNOSIS — Z820 Family history of epilepsy and other diseases of the nervous system: Secondary | ICD-10-CM

## 2015-03-12 DIAGNOSIS — R0683 Snoring: Secondary | ICD-10-CM

## 2015-03-12 DIAGNOSIS — G4710 Hypersomnia, unspecified: Secondary | ICD-10-CM | POA: Diagnosis present

## 2015-03-12 DIAGNOSIS — G8929 Other chronic pain: Secondary | ICD-10-CM

## 2015-03-12 DIAGNOSIS — G4733 Obstructive sleep apnea (adult) (pediatric): Secondary | ICD-10-CM | POA: Insufficient documentation

## 2015-03-12 DIAGNOSIS — R51 Headache: Secondary | ICD-10-CM

## 2015-03-22 ENCOUNTER — Other Ambulatory Visit: Payer: Self-pay | Admitting: *Deleted

## 2015-03-22 ENCOUNTER — Encounter: Payer: Self-pay | Admitting: Family Medicine

## 2015-03-22 DIAGNOSIS — R413 Other amnesia: Secondary | ICD-10-CM

## 2015-03-22 DIAGNOSIS — R51 Headache: Principal | ICD-10-CM

## 2015-03-22 DIAGNOSIS — R519 Headache, unspecified: Secondary | ICD-10-CM

## 2015-03-26 ENCOUNTER — Ambulatory Visit (INDEPENDENT_AMBULATORY_CARE_PROVIDER_SITE_OTHER): Payer: Federal, State, Local not specified - PPO

## 2015-03-26 DIAGNOSIS — R519 Headache, unspecified: Secondary | ICD-10-CM

## 2015-03-26 DIAGNOSIS — R413 Other amnesia: Secondary | ICD-10-CM | POA: Diagnosis not present

## 2015-03-26 DIAGNOSIS — R51 Headache: Secondary | ICD-10-CM

## 2015-03-27 ENCOUNTER — Encounter: Payer: Self-pay | Admitting: Family Medicine

## 2015-03-27 DIAGNOSIS — R519 Headache, unspecified: Secondary | ICD-10-CM

## 2015-03-27 DIAGNOSIS — R51 Headache: Principal | ICD-10-CM

## 2015-03-28 ENCOUNTER — Ambulatory Visit: Payer: Self-pay | Admitting: Family Medicine

## 2015-03-30 DIAGNOSIS — R0683 Snoring: Secondary | ICD-10-CM | POA: Diagnosis not present

## 2015-03-30 DIAGNOSIS — R51 Headache: Secondary | ICD-10-CM | POA: Diagnosis not present

## 2015-03-30 DIAGNOSIS — Z8489 Family history of other specified conditions: Secondary | ICD-10-CM | POA: Diagnosis not present

## 2015-03-30 NOTE — Sleep Study (Signed)
    NAME: Charles Crosby DATE OF BIRTH:  07-Jan-1976 MEDICAL RECORD NUMBER [id number]  LOCATION: [facility]  PHYSICIAN: Cynara Tatham D  DATE OF STUDY: 03/12/2015  SLEEP STUDY TYPE: Out of Center Sleep Test                REFERRING PHYSICIAN: Hali Marry, *  INDICATION FOR STUDY: Hypersomnia with sleep apnea  EPWORTH SLEEPINESS SCORE:   13/24 HEIGHT: 5\' 11"  (180.3 cm)  WEIGHT: 207 lb (93.895 kg)    Body mass index is 28.88 kg/(m^2).  NECK SIZE:   in.  MEDICATIONS: Charted for review  IMPRESSION:  Moderate obstructive sleep apnea/hypopnea syndrome, AHI 21.4 per hour. 128 total events scored including 9 central apneas, 4 obstructive apneas, 3 mixed apneas, 112 hypopneas. Sleep position was not identified. Oxygen desaturation to a nadir of 86% with average oxygen saturation 94%. Mean heart rate 64.4/m.    RECOMMENDATION:  Scores in this range are usually addressed first with CPAP therapy. Weight loss may be an important contribution. On an individual basis, alternative management such as an oral appliance might be a consideration.   Deneise Lever Diplomate, American Board of Sleep Medicine  ELECTRONICALLY SIGNED ON:  03/30/2015, 7:50 PM [place] PH: [phone]   FX: [phone] [place]

## 2015-04-09 ENCOUNTER — Telehealth: Payer: Self-pay | Admitting: Family Medicine

## 2015-04-09 NOTE — Telephone Encounter (Signed)
Please call patient: I have the results of his sleep study back. I would like him to make an appointment so that we can go over the results together.

## 2015-04-17 NOTE — Telephone Encounter (Signed)
Pt notified & was transferred to scheduling. 

## 2015-04-27 ENCOUNTER — Ambulatory Visit: Payer: Self-pay | Admitting: Family Medicine

## 2015-05-02 ENCOUNTER — Ambulatory Visit (INDEPENDENT_AMBULATORY_CARE_PROVIDER_SITE_OTHER): Payer: Federal, State, Local not specified - PPO | Admitting: Family Medicine

## 2015-05-02 ENCOUNTER — Encounter: Payer: Self-pay | Admitting: Family Medicine

## 2015-05-02 VITALS — BP 102/69 | HR 80 | Wt 202.0 lb

## 2015-05-02 DIAGNOSIS — G4733 Obstructive sleep apnea (adult) (pediatric): Secondary | ICD-10-CM

## 2015-05-02 MED ORDER — AMBULATORY NON FORMULARY MEDICATION: Status: DC

## 2015-05-02 NOTE — Progress Notes (Signed)
   Subjective:    Patient ID: Charles Crosby, male    DOB: 1976/02/13, 39 y.o.   MRN: [id number]  HPI Here to follow-up since study results. The sleep study was performed at home. The apnea-hypopnea index was 21.4 per hour. He had a total of 128 events. 9 were central apneas, 4 were obstructive apneas and 3 were mixed apneas. He had a total of 112 type off knee is. Oxygen desatted to 86%. Averages were around 94%. Mean heart rate was 64 beats per minute. He brought his wife with him today to review the information. He says he constantly feels sleepy. His wife says if he sits down even for a few minutes to rest he can sometimes not off easily. She says she's constantly yawning. He's also had some difficulty with persistent chronic headaches over the last year.   Review of Systems     Objective:   Physical Exam  Constitutional: He is oriented to person, place, and time. He appears well-developed and well-nourished.  Neurological: He is alert and oriented to person, place, and time.  Psychiatric: He has a normal mood and affect. His behavior is normal.          Assessment & Plan:  Sleep apnea, moderate, AHI of 21.4 per hour. New Dx.  Reviewed results. Given copy of sleep study report. I would like to move forward with CPAP. We discussed option of having a CPAP titration study done in the lab versus a home AutoPap titration. He would prefer to do the home one as he does have PTSD and often times will kick in punch in his sleep. We also discussed that he could be a candidate for an oral appliance and certainly that something we could pursue if he would like. I would recommend that we start with CPAP though.  Time spent 20 mint, > 50% spent counseling about CPAP and benefit of treatment, and CPAP.

## 2015-05-02 NOTE — Patient Instructions (Signed)
Sleep Apnea  Sleep apnea is a sleep disorder characterized by abnormal pauses in breathing while you sleep. When your breathing pauses, the level of oxygen in your blood decreases. This causes you to move out of deep sleep and into light sleep. As a result, your quality of sleep is poor, and the system that carries your blood throughout your body (cardiovascular system) experiences stress. If sleep apnea remains untreated, the following conditions can develop:  High blood pressure (hypertension).  Coronary artery disease.  Inability to achieve or maintain an erection (impotence).  Impairment of your thought process (cognitive dysfunction). There are three types of sleep apnea: 1. Obstructive sleep apnea--Pauses in breathing during sleep because of a blocked airway. 2. Central sleep apnea--Pauses in breathing during sleep because the area of the brain that controls your breathing does not send the correct signals to the muscles that control breathing. 3. Mixed sleep apnea--A combination of both obstructive and central sleep apnea. RISK FACTORS The following risk factors can increase your risk of developing sleep apnea:  Being overweight.  Smoking.  Having narrow passages in your nose and throat.  Being of older age.  Being male.  Alcohol use.  Sedative and tranquilizer use.  Ethnicity. Among individuals younger than 35 years, African Americans are at increased risk of sleep apnea. SYMPTOMS   Difficulty staying asleep.  Daytime sleepiness and fatigue.  Loss of energy.  Irritability.  Loud, heavy snoring.  Morning headaches.  Trouble concentrating.  Forgetfulness.  Decreased interest in sex. DIAGNOSIS  In order to diagnose sleep apnea, your caregiver will perform a physical examination. Your caregiver may suggest that you take a home sleep test. Your caregiver may also recommend that you spend the night in a sleep lab. In the sleep lab, several monitors record  information about your heart, lungs, and brain while you sleep. Your leg and arm movements and blood oxygen level are also recorded. TREATMENT The following actions may help to resolve mild sleep apnea:  Sleeping on your side.   Using a decongestant if you have nasal congestion.   Avoiding the use of depressants, including alcohol, sedatives, and narcotics.   Losing weight and modifying your diet if you are overweight. There also are devices and treatments to help open your airway:  Oral appliances. These are custom-made mouthpieces that shift your lower jaw forward and slightly open your bite. This opens your airway.  Devices that create positive airway pressure. This positive pressure "splints" your airway open to help you breathe better during sleep. The following devices create positive airway pressure:  Continuous positive airway pressure (CPAP) device. The CPAP device creates a continuous level of air pressure with an air pump. The air is delivered to your airway through a mask while you sleep. This continuous pressure keeps your airway open.  Nasal expiratory positive airway pressure (EPAP) device. The EPAP device creates positive air pressure as you exhale. The device consists of single-use valves, which are inserted into each nostril and held in place by adhesive. The valves create very little resistance when you inhale but create much more resistance when you exhale. That increased resistance creates the positive airway pressure. This positive pressure while you exhale keeps your airway open, making it easier to breath when you inhale again.  Bilevel positive airway pressure (BPAP) device. The BPAP device is used mainly in patients with central sleep apnea. This device is similar to the CPAP device because it also uses an air pump to deliver continuous air pressure   through a mask. However, with the BPAP machine, the pressure is set at two different levels. The pressure when you  exhale is lower than the pressure when you inhale.  Surgery. Typically, surgery is only done if you cannot comply with less invasive treatments or if the less invasive treatments do not improve your condition. Surgery involves removing excess tissue in your airway to create a wider passage way. Document Released: 10/24/2002 Document Revised: 02/28/2013 Document Reviewed: 03/11/2012 Exit[employer] Patient Information 2015 [employer]. This information is not intended to replace advice given to you by your health care provider. Make sure you discuss any questions you have with your health care provider.  

## 2015-05-07 ENCOUNTER — Other Ambulatory Visit: Payer: Self-pay | Admitting: Family Medicine

## 2015-05-07 MED ORDER — AMBULATORY NON FORMULARY MEDICATION: Status: DC

## 2015-05-07 NOTE — Telephone Encounter (Signed)
New Rx was required with pressure range.

## 2015-06-06 ENCOUNTER — Ambulatory Visit: Payer: Self-pay | Admitting: Family Medicine

## 2015-06-08 ENCOUNTER — Encounter: Payer: Federal, State, Local not specified - PPO | Admitting: Family Medicine

## 2015-06-11 NOTE — Progress Notes (Signed)
   Subjective:    Patient ID: Charles Crosby, male    DOB: 12/21/1975, 39 y.o.   MRN: [id number]  HPI Error    Review of Systems     Objective:   Physical Exam        Assessment & Plan:

## 2015-07-06 ENCOUNTER — Telehealth: Payer: Self-pay | Admitting: *Deleted

## 2015-07-06 ENCOUNTER — Encounter: Payer: Self-pay | Admitting: Family Medicine

## 2015-07-06 ENCOUNTER — Ambulatory Visit (INDEPENDENT_AMBULATORY_CARE_PROVIDER_SITE_OTHER): Payer: Federal, State, Local not specified - PPO | Admitting: Family Medicine

## 2015-07-06 VITALS — BP 90/57 | HR 53 | Wt 199.0 lb

## 2015-07-06 DIAGNOSIS — G4733 Obstructive sleep apnea (adult) (pediatric): Secondary | ICD-10-CM

## 2015-07-06 DIAGNOSIS — Z9989 Dependence on other enabling machines and devices: Principal | ICD-10-CM

## 2015-07-06 DIAGNOSIS — R51 Headache: Secondary | ICD-10-CM

## 2015-07-06 DIAGNOSIS — Z23 Encounter for immunization: Secondary | ICD-10-CM

## 2015-07-06 DIAGNOSIS — R519 Headache, unspecified: Secondary | ICD-10-CM

## 2015-07-06 NOTE — Progress Notes (Signed)
   Subjective:    Patient ID: Charles Crosby, male    DOB: September 20, 1976, 39 y.o.   MRN: [id number]  HPI OSA - he is doing well with the CPAP. Using is most night.  Wife has notices some improvement in daytime energy levels.  The yawning is much better.  Feels like the nasal pillows are working well but might like to try a mask similar to his fathers.   Chronic daily headaches-he's actually been started on new medications by his neurologist. Coreg 40 mg once at bedtime. He's also been started on a new medication called Nuedexta.    Review of Systems     Objective:   Physical Exam  Constitutional: He is oriented to person, place, and time. He appears well-developed and well-nourished.  HENT:  Head: Normocephalic and atraumatic.  Cardiovascular: Normal rate, regular rhythm and normal heart sounds.   Pulmonary/Chest: Effort normal and breath sounds normal.  Neurological: He is alert and oriented to person, place, and time.  Skin: Skin is warm and dry.  Psychiatric: He has a normal mood and affect. His behavior is normal.          Assessment & Plan:  OSA- he's doing well with the device and has been able to use it almost every night. We'll call to get a download for his care so that we can set his CPAP to consistent pressure. Discussed with him that if he feels like it's too high give [place] a call we cannot start down a little and then slowly overtime adjust the level back up. Continue to try to use every night if at all possible. I would like to see him back in 6 months. He has noticed improved energy and less daytime sedation and sleepiness.  Chronic daily headache-hopefully will do well on new medication regimen. He says he is not quite sure yet if it's in helpful or not but is continuing with it.

## 2015-07-06 NOTE — Telephone Encounter (Signed)
Called and lvm asking that someone fax the download from pt's cpap to our office. Maryruth Eve, Lahoma Crocker

## 2015-08-16 ENCOUNTER — Encounter: Payer: Self-pay | Admitting: Family Medicine

## 2015-08-17 ENCOUNTER — Other Ambulatory Visit: Payer: Self-pay

## 2015-08-17 ENCOUNTER — Other Ambulatory Visit: Payer: Self-pay | Admitting: Family Medicine

## 2015-08-17 DIAGNOSIS — T39311A Poisoning by propionic acid derivatives, accidental (unintentional), initial encounter: Secondary | ICD-10-CM

## 2015-08-17 LAB — BASIC METABOLIC PANEL WITH GFR
BUN: 17 mg/dL (ref 7–25)
CHLORIDE: 101 mmol/L (ref 98–110)
CO2: 27 mmol/L (ref 20–31)
CREATININE: 1.04 mg/dL (ref 0.60–1.35)
Calcium: 9.2 mg/dL (ref 8.6–10.3)
GFR, Est African American: >89 mL/min (ref >=60)
GFR, Est Non African American: >89 mL/min (ref >=60)
GLUCOSE: 92 mg/dL (ref 65–99)
POTASSIUM: 4.5 mmol/L (ref 3.5–5.3)
Sodium: 135 mmol/L (ref 135–146)

## 2015-10-02 ENCOUNTER — Encounter (HOSPITAL_COMMUNITY)
Admission: EM | Disposition: A | Discharge status: Home / Self Care | Payer: Self-pay | Source: Home / Self Care | Attending: Emergency Medicine

## 2015-10-02 ENCOUNTER — Encounter: Payer: Self-pay | Admitting: Physician Assistant

## 2015-10-02 ENCOUNTER — Observation Stay (HOSPITAL_COMMUNITY)
Admission: EM | Admit: 2015-10-02 | Discharge: 2015-10-03 | Disposition: A | Discharge status: Home / Self Care | Payer: Federal, State, Local not specified - PPO | Attending: General Surgery | Admitting: General Surgery

## 2015-10-02 ENCOUNTER — Observation Stay (HOSPITAL_COMMUNITY): Payer: Federal, State, Local not specified - PPO | Admitting: Anesthesiology

## 2015-10-02 ENCOUNTER — Ambulatory Visit (INDEPENDENT_AMBULATORY_CARE_PROVIDER_SITE_OTHER): Payer: Federal, State, Local not specified - PPO

## 2015-10-02 ENCOUNTER — Encounter (HOSPITAL_COMMUNITY): Payer: Self-pay

## 2015-10-02 ENCOUNTER — Ambulatory Visit (INDEPENDENT_AMBULATORY_CARE_PROVIDER_SITE_OTHER): Payer: Federal, State, Local not specified - PPO | Admitting: Physician Assistant

## 2015-10-02 VITALS — BP 116/76 | HR 73 | Temp 98.3°F | Ht 71.0 in | Wt 202.0 lb

## 2015-10-02 DIAGNOSIS — G4730 Sleep apnea, unspecified: Secondary | ICD-10-CM | POA: Insufficient documentation

## 2015-10-02 DIAGNOSIS — R300 Dysuria: Secondary | ICD-10-CM | POA: Diagnosis not present

## 2015-10-02 DIAGNOSIS — H9193 Unspecified hearing loss, bilateral: Secondary | ICD-10-CM | POA: Diagnosis not present

## 2015-10-02 DIAGNOSIS — F4310 Post-traumatic stress disorder, unspecified: Secondary | ICD-10-CM | POA: Diagnosis not present

## 2015-10-02 DIAGNOSIS — K3580 Unspecified acute appendicitis: Principal | ICD-10-CM | POA: Diagnosis present

## 2015-10-02 DIAGNOSIS — F17220 Nicotine dependence, chewing tobacco, uncomplicated: Secondary | ICD-10-CM | POA: Diagnosis not present

## 2015-10-02 DIAGNOSIS — R10813 Right lower quadrant abdominal tenderness: Secondary | ICD-10-CM | POA: Diagnosis not present

## 2015-10-02 DIAGNOSIS — R1031 Right lower quadrant pain: Secondary | ICD-10-CM | POA: Diagnosis present

## 2015-10-02 DIAGNOSIS — R109 Unspecified abdominal pain: Secondary | ICD-10-CM

## 2015-10-02 DIAGNOSIS — Z87820 Personal history of traumatic brain injury: Secondary | ICD-10-CM | POA: Diagnosis not present

## 2015-10-02 HISTORY — PX: LAPAROSCOPIC APPENDECTOMY: SHX408

## 2015-10-02 HISTORY — DX: Post-traumatic stress disorder, unspecified: F43.10

## 2015-10-02 LAB — CBC WITH DIFFERENTIAL/PLATELET
BASOS ABS: 0 10*3/uL (ref 0.0–0.1)
BASOS PCT: 1 % (ref 0–1)
Basophils Absolute: 0.1 10*3/uL (ref 0.0–0.1)
Basophils Relative: 1 %
EOS ABS: 0.7 10*3/uL (ref 0.0–0.7)
EOS ABS: 0.5 10*3/uL (ref 0.0–0.7)
EOS PCT: 9 %
Eosinophils Relative: 12 % — ABNORMAL HIGH (ref 0–5)
HCT: 38 % — ABNORMAL LOW (ref 39.0–52.0)
HCT: 38.1 % — ABNORMAL LOW (ref 39.0–52.0)
Hemoglobin: 13 g/dL (ref 13.0–17.0)
Hemoglobin: 12.9 g/dL — ABNORMAL LOW (ref 13.0–17.0)
LYMPHS ABS: 0.8 10*3/uL (ref 0.7–4.0)
Lymphocytes Relative: 16 % (ref 12–46)
Lymphocytes Relative: 14 %
Lymphs Abs: 0.9 10*3/uL (ref 0.7–4.0)
MCH: 31.2 pg (ref 26.0–34.0)
MCH: 31.2 pg (ref 26.0–34.0)
MCHC: 34.2 g/dL (ref 30.0–36.0)
MCHC: 33.9 g/dL (ref 30.0–36.0)
MCV: 91.1 fL (ref 78.0–100.0)
MCV: 92 fL (ref 78.0–100.0)
MONO ABS: 0.7 10*3/uL (ref 0.1–1.0)
MONOS PCT: 12 % (ref 3–12)
MPV: 10.8 fL (ref 8.6–12.4)
Monocytes Absolute: 0.7 10*3/uL (ref 0.1–1.0)
Monocytes Relative: 13 %
NEUTROS PCT: 59 % (ref 43–77)
Neutro Abs: 3.4 10*3/uL (ref 1.7–7.7)
Neutro Abs: 3.6 10*3/uL (ref 1.7–7.7)
Neutrophils Relative %: 63 %
PLATELETS: 207 10*3/uL (ref 150–400)
PLATELETS: 186 10*3/uL (ref 150–400)
RBC: 4.17 MIL/uL — AB (ref 4.22–5.81)
RBC: 4.14 MIL/uL — AB (ref 4.22–5.81)
RDW: 13.4 % (ref 11.5–15.5)
RDW: 12.2 % (ref 11.5–15.5)
WBC: 5.8 10*3/uL (ref 4.0–10.5)
WBC: 5.6 10*3/uL (ref 4.0–10.5)

## 2015-10-02 LAB — COMPLETE METABOLIC PANEL WITH GFR
ALBUMIN: 3.9 g/dL (ref 3.6–5.1)
ALT: 26 U/L (ref 9–46)
AST: 29 U/L (ref 10–40)
Alkaline Phosphatase: 43 U/L (ref 40–115)
BILIRUBIN TOTAL: 0.4 mg/dL (ref 0.2–1.2)
BUN: 15 mg/dL (ref 7–25)
CALCIUM: 8.7 mg/dL (ref 8.6–10.3)
CO2: 26 mmol/L (ref 20–31)
CREATININE: 1.08 mg/dL (ref 0.60–1.35)
Chloride: 95 mmol/L — ABNORMAL LOW (ref 98–110)
GFR, Est Non African American: 86 mL/min (ref >=60)
Glucose, Bld: 100 mg/dL — ABNORMAL HIGH (ref 65–99)
Potassium: 4.1 mmol/L (ref 3.5–5.3)
SODIUM: 132 mmol/L — AB (ref 135–146)
TOTAL PROTEIN: 6.4 g/dL (ref 6.1–8.1)

## 2015-10-02 LAB — BASIC METABOLIC PANEL
Anion gap: 11 (ref 5–15)
BUN: 15 mg/dL (ref 6–20)
CALCIUM: 9 mg/dL (ref 8.9–10.3)
CO2: 25 mmol/L (ref 22–32)
Chloride: 96 mmol/L — ABNORMAL LOW (ref 101–111)
Creatinine, Ser: 1.21 mg/dL (ref 0.61–1.24)
GFR calc Af Amer: >60 mL/min (ref >60)
Glucose, Bld: 106 mg/dL — ABNORMAL HIGH (ref 65–99)
POTASSIUM: 4.1 mmol/L (ref 3.5–5.1)
SODIUM: 132 mmol/L — AB (ref 135–145)

## 2015-10-02 LAB — SURGICAL PCR SCREEN
MRSA, PCR: NEGATIVE
Staphylococcus aureus: NEGATIVE

## 2015-10-02 SURGERY — APPENDECTOMY, LAPAROSCOPIC
Anesthesia: General

## 2015-10-02 MED ORDER — FLUOXETINE HCL 20 MG PO CAPS
80.0000 mg | ORAL_CAPSULE | Freq: Every day | ORAL | Status: DC
  Administered 2015-10-03: 80 mg via ORAL
  Filled 2015-10-02: qty 4

## 2015-10-02 MED ORDER — LACTATED RINGERS IV SOLN
INTRAVENOUS | Status: DC | PRN
  Administered 2015-10-02: 19:00:00 via INTRAVENOUS

## 2015-10-02 MED ORDER — LIDOCAINE HCL (CARDIAC) 20 MG/ML IV SOLN
INTRAVENOUS | Status: DC | PRN
  Administered 2015-10-02: 50 mg via INTRAVENOUS

## 2015-10-02 MED ORDER — FENTANYL CITRATE (PF) 250 MCG/5ML IJ SOLN
INTRAMUSCULAR | Status: AC
  Filled 2015-10-02: qty 25

## 2015-10-02 MED ORDER — HYDROCODONE-ACETAMINOPHEN 7.5-325 MG PO TABS: 1.0000 | ORAL_TABLET | Freq: Three times a day (TID) | ORAL | Status: DC | PRN

## 2015-10-02 MED ORDER — FENTANYL CITRATE (PF) 100 MCG/2ML IJ SOLN
INTRAMUSCULAR | Status: AC
  Filled 2015-10-02: qty 2

## 2015-10-02 MED ORDER — BUPIVACAINE-EPINEPHRINE 0.25% -1:200000 IJ SOLN
INTRAMUSCULAR | Status: DC | PRN
  Administered 2015-10-02: 11 mL

## 2015-10-02 MED ORDER — DEXTROMETHORPHAN-QUINIDINE 20-10 MG PO CAPS
1.0000 | ORAL_CAPSULE | Freq: Every day | ORAL | Status: DC
  Administered 2015-10-03: 10:00:00 via ORAL
  Filled 2015-10-02: qty 1

## 2015-10-02 MED ORDER — SUGAMMADEX SODIUM 200 MG/2ML IV SOLN
INTRAVENOUS | Status: DC | PRN
  Administered 2015-10-02: 200 mg via INTRAVENOUS

## 2015-10-02 MED ORDER — PIPERACILLIN-TAZOBACTAM 3.375 G IVPB
3.3750 g | Freq: Three times a day (TID) | INTRAVENOUS | Status: DC
  Administered 2015-10-02 – 2015-10-03 (×3): 3.375 g via INTRAVENOUS
  Filled 2015-10-02 (×5): qty 50

## 2015-10-02 MED ORDER — DIPHENHYDRAMINE HCL 50 MG/ML IJ SOLN: 25.0000 mg | Freq: Four times a day (QID) | INTRAMUSCULAR | Status: DC | PRN

## 2015-10-02 MED ORDER — SODIUM CHLORIDE 0.9 % IV BOLUS (SEPSIS): 1000.0000 mL | Freq: Once | INTRAVENOUS | Status: DC

## 2015-10-02 MED ORDER — LAMOTRIGINE 25 MG PO TABS
75.0000 mg | ORAL_TABLET | Freq: Every day | ORAL | Status: DC
  Administered 2015-10-03: 75 mg via ORAL
  Filled 2015-10-02 (×2): qty 3

## 2015-10-02 MED ORDER — CEFTRIAXONE SODIUM 2 G IJ SOLR
2.0000 g | Freq: Once | INTRAMUSCULAR | Status: DC
  Administered 2015-10-02: 2 g via INTRAVENOUS
  Filled 2015-10-02: qty 2

## 2015-10-02 MED ORDER — FENTANYL CITRATE (PF) 100 MCG/2ML IJ SOLN
INTRAMUSCULAR | Status: DC | PRN
  Administered 2015-10-02: 50 ug via INTRAVENOUS
  Administered 2015-10-02: 100 ug via INTRAVENOUS
  Administered 2015-10-02 (×2): 50 ug via INTRAVENOUS

## 2015-10-02 MED ORDER — SODIUM CHLORIDE 0.9 % IV SOLN
INTRAVENOUS | Status: DC
  Administered 2015-10-02: 15:00:00 via INTRAVENOUS

## 2015-10-02 MED ORDER — ONDANSETRON HCL 4 MG/2ML IJ SOLN
INTRAMUSCULAR | Status: DC | PRN
  Administered 2015-10-02: 4 mg via INTRAVENOUS

## 2015-10-02 MED ORDER — PROPOFOL 10 MG/ML IV BOLUS
INTRAVENOUS | Status: DC | PRN
  Administered 2015-10-02: 200 mg via INTRAVENOUS

## 2015-10-02 MED ORDER — BUPROPION HCL ER (XL) 300 MG PO TB24
300.0000 mg | ORAL_TABLET | Freq: Every day | ORAL | Status: DC
  Administered 2015-10-03: 300 mg via ORAL
  Filled 2015-10-02: qty 1

## 2015-10-02 MED ORDER — PROPOFOL 10 MG/ML IV BOLUS
INTRAVENOUS | Status: AC
  Filled 2015-10-02: qty 20

## 2015-10-02 MED ORDER — POTASSIUM CHLORIDE IN NACL 20-0.9 MEQ/L-% IV SOLN
INTRAVENOUS | Status: DC
  Administered 2015-10-02: 17:00:00 via INTRAVENOUS
  Filled 2015-10-02 (×5): qty 1000

## 2015-10-02 MED ORDER — LACTATED RINGERS IR SOLN
Status: DC | PRN
  Administered 2015-10-02: 1000 mL

## 2015-10-02 MED ORDER — DIVALPROEX SODIUM 500 MG PO DR TAB
1250.0000 mg | DELAYED_RELEASE_TABLET | Freq: Every day | ORAL | Status: DC
  Administered 2015-10-02: 1250 mg via ORAL
  Filled 2015-10-02 (×2): qty 1

## 2015-10-02 MED ORDER — PROMETHAZINE HCL 25 MG/ML IJ SOLN: 6.2500 mg | INTRAMUSCULAR | Status: DC | PRN

## 2015-10-02 MED ORDER — BUPIVACAINE-EPINEPHRINE (PF) 0.25% -1:200000 IJ SOLN
INTRAMUSCULAR | Status: AC
  Filled 2015-10-02: qty 30

## 2015-10-02 MED ORDER — FENTANYL CITRATE (PF) 100 MCG/2ML IJ SOLN
25.0000 ug | INTRAMUSCULAR | Status: DC | PRN
  Administered 2015-10-02 (×2): 50 ug via INTRAVENOUS

## 2015-10-02 MED ORDER — METRONIDAZOLE IN NACL 5-0.79 MG/ML-% IV SOLN: 500.0000 mg | Freq: Once | INTRAVENOUS | Status: DC

## 2015-10-02 MED ORDER — ROCURONIUM BROMIDE 100 MG/10ML IV SOLN
INTRAVENOUS | Status: DC | PRN
  Administered 2015-10-02: 40 mg via INTRAVENOUS

## 2015-10-02 MED ORDER — MIDAZOLAM HCL 2 MG/2ML IJ SOLN
INTRAMUSCULAR | Status: AC
  Filled 2015-10-02: qty 4

## 2015-10-02 MED ORDER — LIDOCAINE HCL (CARDIAC) 20 MG/ML IV SOLN
INTRAVENOUS | Status: AC
  Filled 2015-10-02: qty 5

## 2015-10-02 MED ORDER — MORPHINE SULFATE (PF) 2 MG/ML IV SOLN
2.0000 mg | INTRAVENOUS | Status: DC | PRN
  Administered 2015-10-02: 4 mg via INTRAVENOUS
  Administered 2015-10-02: 2 mg via INTRAVENOUS
  Administered 2015-10-03 (×2): 4 mg via INTRAVENOUS
  Filled 2015-10-02 (×2): qty 1
  Filled 2015-10-02 (×3): qty 2

## 2015-10-02 MED ORDER — DIPHENHYDRAMINE HCL 25 MG PO CAPS: 25.0000 mg | ORAL_CAPSULE | Freq: Four times a day (QID) | ORAL | Status: DC | PRN

## 2015-10-02 MED ORDER — OXYCODONE-ACETAMINOPHEN 5-325 MG PO TABS
1.0000 | ORAL_TABLET | ORAL | Status: DC | PRN
  Administered 2015-10-03: 1 via ORAL
  Administered 2015-10-03: 2 via ORAL
  Administered 2015-10-03: 1 via ORAL
  Filled 2015-10-02 (×2): qty 2
  Filled 2015-10-02: qty 1

## 2015-10-02 MED ORDER — ONDANSETRON HCL 4 MG/2ML IJ SOLN: 4.0000 mg | Freq: Four times a day (QID) | INTRAMUSCULAR | Status: DC | PRN

## 2015-10-02 MED ORDER — SUCCINYLCHOLINE CHLORIDE 20 MG/ML IJ SOLN
INTRAMUSCULAR | Status: DC | PRN
  Administered 2015-10-02: 100 mg via INTRAVENOUS

## 2015-10-02 MED ORDER — ONDANSETRON HCL 4 MG/2ML IJ SOLN
INTRAMUSCULAR | Status: AC
  Filled 2015-10-02: qty 2

## 2015-10-02 MED ORDER — MIDAZOLAM HCL 5 MG/5ML IJ SOLN
INTRAMUSCULAR | Status: DC | PRN
  Administered 2015-10-02: 2 mg via INTRAVENOUS

## 2015-10-02 MED ORDER — ONDANSETRON 4 MG PO TBDP: 4.0000 mg | ORAL_TABLET | Freq: Four times a day (QID) | ORAL | Status: DC | PRN

## 2015-10-02 SURGICAL SUPPLY — 39 items
APPLIER CLIP 5 13 M/L LIGAMAX5 (MISCELLANEOUS)
APPLIER CLIP ROT 10 11.4 M/L (STAPLE)
APR CLP MED LRG 11.4X10 (STAPLE)
APR CLP MED LRG 5 ANG JAW (MISCELLANEOUS)
BAG SPEC RTRVL LRG 6X4 10 (ENDOMECHANICALS) ×1
CABLE HIGH FREQUENCY MONO STRZ (ELECTRODE) ×2 IMPLANT
CHLORAPREP W/TINT 26ML (MISCELLANEOUS) ×3 IMPLANT
CLIP APPLIE 5 13 M/L LIGAMAX5 (MISCELLANEOUS) IMPLANT
CLIP APPLIE ROT 10 11.4 M/L (STAPLE) IMPLANT
CUTTER FLEX LINEAR 45M (STAPLE) ×2 IMPLANT
DRAPE LAPAROSCOPIC ABDOMINAL (DRAPES) ×3 IMPLANT
ELECT REM PT RETURN 9FT ADLT (ELECTROSURGICAL) ×3
ELECTRODE REM PT RTRN 9FT ADLT (ELECTROSURGICAL) ×1 IMPLANT
GLOVE BIOGEL PI IND STRL 6.5 (GLOVE) IMPLANT
GLOVE BIOGEL PI IND STRL 7.5 (GLOVE) ×1 IMPLANT
GLOVE BIOGEL PI INDICATOR 6.5 (GLOVE) ×2
GLOVE BIOGEL PI INDICATOR 7.5 (GLOVE) ×2
GLOVE ECLIPSE 7.5 STRL STRAW (GLOVE) ×3 IMPLANT
GLOVE SURG SS PI 6.5 STRL IVOR (GLOVE) ×2 IMPLANT
GOWN SRG XL XLNG 56XLVL 4 (GOWN DISPOSABLE) IMPLANT
GOWN STRL NON-REIN XL XLG LVL4 (GOWN DISPOSABLE) ×3
GOWN STRL REUS W/TWL XL LVL3 (GOWN DISPOSABLE) ×4 IMPLANT
KIT BASIN OR (CUSTOM PROCEDURE TRAY) ×3 IMPLANT
LIQUID BAND (GAUZE/BANDAGES/DRESSINGS) IMPLANT
POUCH SPECIMEN RETRIEVAL 10MM (ENDOMECHANICALS) ×3 IMPLANT
RELOAD 45 VASCULAR/THIN (ENDOMECHANICALS) IMPLANT
RELOAD STAPLE 45 2.5 WHT GRN (ENDOMECHANICALS) IMPLANT
RELOAD STAPLE 45 3.5 BLU ETS (ENDOMECHANICALS) IMPLANT
RELOAD STAPLE TA45 3.5 REG BLU (ENDOMECHANICALS) ×3 IMPLANT
SCISSORS LAP 5X35 DISP (ENDOMECHANICALS) ×3 IMPLANT
SET IRRIG TUBING LAPAROSCOPIC (IRRIGATION / IRRIGATOR) ×3 IMPLANT
SHEARS HARMONIC ACE PLUS 36CM (ENDOMECHANICALS) ×3 IMPLANT
SLEEVE XCEL OPT CAN 5 100 (ENDOMECHANICALS) ×3 IMPLANT
SUT MNCRL AB 4-0 PS2 18 (SUTURE) ×3 IMPLANT
TOWEL OR 17X26 10 PK STRL BLUE (TOWEL DISPOSABLE) ×3 IMPLANT
TRAY FOLEY W/METER SILVER 16FR (SET/KITS/TRAYS/PACK) ×3 IMPLANT
TRAY LAPAROSCOPIC (CUSTOM PROCEDURE TRAY) ×3 IMPLANT
TROCAR BLADELESS OPT 5 100 (ENDOMECHANICALS) ×3 IMPLANT
TROCAR XCEL BLUNT TIP 100MML (ENDOMECHANICALS) ×3 IMPLANT

## 2015-10-02 NOTE — ED Notes (Signed)
Patient saw PCP today and had a CT scan completed which patient states he has appendicitis. Patient has right lower abdominal pain and right lower back pain. X 4 days.

## 2015-10-02 NOTE — Progress Notes (Addendum)
Spoke with pt regarding cpap per RT consult.  Pt stated he wears cpap qhs on auto mode with nasal pillows.  Pt placed on cpap with nasal mask (nasal pillows not available) on autotitration mode min 8cm per pt comfort- max 20cm h2o.  Pt tolerating well at this time.  RN aware.

## 2015-10-02 NOTE — Anesthesia Procedure Notes (Signed)
Procedure Name: Intubation Performed by: Jeanell Mangan J Pre-anesthesia Checklist: Patient identified, Emergency Drugs available, Suction available, Patient being monitored and Timeout performed Patient Re-evaluated:Patient Re-evaluated prior to inductionOxygen Delivery Method: Circle system utilized Preoxygenation: Pre-oxygenation with 100% oxygen Intubation Type: IV induction Ventilation: Mask ventilation without difficulty Laryngoscope Size: Mac and 4 Grade View: Grade II Tube type: Oral Tube size: 7.5 mm Number of attempts: 1 Airway Equipment and Method: Stylet Placement Confirmation: ETT inserted through vocal cords under direct vision,  positive ETCO2,  CO2 detector and breath sounds checked- equal and bilateral Secured at: 23 cm Tube secured with: Tape Dental Injury: Teeth and Oropharynx as per pre-operative assessment        
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
## 2015-10-02 NOTE — Anesthesia Postprocedure Evaluation (Signed)
  Anesthesia Post-op Note  Patient: Charles Crosby  Procedure(s) Performed: Procedure(s) (LRB): APPENDECTOMY LAPAROSCOPIC (N/A)  Patient Location: PACU  Anesthesia Type: General  Level of Consciousness: awake and alert   Airway and Oxygen Therapy: Patient Spontanous Breathing  Post-op Pain: mild  Post-op Assessment: Post-op Vital signs reviewed, Patient's Cardiovascular Status Stable, Respiratory Function Stable, Patent Airway and No signs of Nausea or vomiting  Last Vitals:  Filed Vitals:   10/02/15 2020  BP:   Pulse: 92  Temp:   Resp: 14    Post-op Vital Signs: stable   Complications: No apparent anesthesia complications

## 2015-10-02 NOTE — ED Notes (Signed)
Report given to Dawn, RN.  

## 2015-10-02 NOTE — Progress Notes (Signed)
   Subjective:    Patient ID: Charles Crosby, male    DOB: 19-Aug-1976, 39 y.o.   MRN: [id number]  HPI   Patient is a 39 year old male who presents to the clinic with worsening right flank pain. He went to the emergency room and [place] on 09/29/15. His pain was 4 out of 10 and has now worsened to 6 out of 10. There was no blood in his urine at the visit. Urine culture still pending. They did to x-rays of lumbar spine and pelvis that were normal. He was given some pain medicine that does take the edge off but there is no significant improvement. He is having a lot of night sweats. He denies any fever or nausea. He denies any bowel changes diarrhea or constipation. The pain worsens when he lays on his right side. There is pain with urination. There is no difficulty urinating. He denies any known injury. Denies any radiation of pain into buttocks or down either leg.    Review of Systems  All other systems reviewed and are negative.      Objective:   Physical Exam  Constitutional: He is oriented to person, place, and time.  Patient does appear weak and pale skin tone.   HENT:  Head: Normocephalic and atraumatic.  Cardiovascular: Normal rate, regular rhythm and normal heart sounds.   Pulmonary/Chest: Effort normal and breath sounds normal.  Positive right CVA tenderness.   Abdominal: Soft. Bowel sounds are normal. He exhibits no distension and no mass. There is tenderness. There is no rebound and no guarding.  RLQ tenderness.  Musculoskeletal:  Straight leg test on right was very painful with pain located in right flank area.  Normal ROM at waist.  Pain with side to side movements all in right flank area.   Neurological: He is alert and oriented to person, place, and time.  Psychiatric: He has a normal mood and affect. His behavior is normal.          Assessment & Plan:  Right flank pain/RLQ tenderness/dysuria- I would like to get a CT of the abdomen with stone protocol. I'm  very suspicious of nephrolithiasis however with the right lower quadrant tenderness possibly could be some pathology of appendicitis there. Will get stat CBC and CMP. We handed patient a bad to capture a urine sample and can return to the lab. Will reculture urine. Norco was given for pain control. Encouraged patient to stay hydrated. We'll call with new information.  Addendum-CT was done and shows signs of early appendicitis. [facility] was called. They will be waiting on him Magda Paganini long for admission and surgical removal. Patient called and aware of results and treatment plan.

## 2015-10-02 NOTE — Patient Instructions (Signed)
Kidney Stones °Kidney stones (urolithiasis) are deposits that form inside your kidneys. The intense pain is caused by the stone moving through the urinary tract. When the stone moves, the ureter goes into spasm around the stone. The stone is usually passed in the urine.  °CAUSES  °· A disorder that makes certain neck glands produce too much parathyroid hormone (primary hyperparathyroidism). °· A buildup of uric acid crystals, similar to gout in your joints. °· Narrowing (stricture) of the ureter. °· A kidney obstruction present at birth (congenital obstruction). °· Previous surgery on the kidney or ureters. °· Numerous kidney infections. °SYMPTOMS  °· Feeling sick to your stomach (nauseous). °· Throwing up (vomiting). °· Blood in the urine (hematuria). °· Pain that usually spreads (radiates) to the groin. °· Frequency or urgency of urination. °DIAGNOSIS  °· Taking a history and physical exam. °· Blood or urine tests. °· CT scan. °· Occasionally, an examination of the inside of the urinary bladder (cystoscopy) is performed. °TREATMENT  °· Observation. °· Increasing your fluid intake. °· Extracorporeal shock wave lithotripsy--This is a noninvasive procedure that uses shock waves to break up kidney stones. °· Surgery may be needed if you have severe pain or persistent obstruction. There are various surgical procedures. Most of the procedures are performed with the use of small instruments. Only small incisions are needed to accommodate these instruments, so recovery time is minimized. °The size, location, and chemical composition are all important variables that will determine the proper choice of action for you. Talk to your health care provider to better understand your situation so that you will minimize the risk of injury to yourself and your kidney.  °HOME CARE INSTRUCTIONS  °· Drink enough water and fluids to keep your urine clear or pale yellow. This will help you to pass the stone or stone fragments. °· Strain  all urine through the provided strainer. Keep all particulate matter and stones for your health care provider to see. The stone causing the pain may be as small as a grain of salt. It is very important to use the strainer each and every time you pass your urine. The collection of your stone will allow your health care provider to analyze it and verify that a stone has actually passed. The stone analysis will often identify what you can do to reduce the incidence of recurrences. °· Only take over-the-counter or prescription medicines for pain, discomfort, or fever as directed by your health care provider. °· Keep all follow-up visits as told by your health care provider. This is important. °· Get follow-up X-rays if required. The absence of pain does not always mean that the stone has passed. It may have only stopped moving. If the urine remains completely obstructed, it can cause loss of kidney function or even complete destruction of the kidney. It is your responsibility to make sure X-rays and follow-ups are completed. Ultrasounds of the kidney can show blockages and the status of the kidney. Ultrasounds are not associated with any radiation and can be performed easily in a matter of minutes. °· Make changes to your daily diet as told by your health care provider. You may be told to: °¨ Limit the amount of salt that you eat. °¨ Eat 5 or more servings of fruits and vegetables each day. °¨ Limit the amount of meat, poultry, fish, and eggs that you eat. °· Collect a 24-hour urine sample as told by your health care provider. You may need to collect another urine sample every 6-12   months. °SEEK MEDICAL CARE IF: °· You experience pain that is progressive and unresponsive to any pain medicine you have been prescribed. °SEEK IMMEDIATE MEDICAL CARE IF:  °· Pain cannot be controlled with the prescribed medicine. °· You have a fever or shaking chills. °· The severity or intensity of pain increases over 18 hours and is not  relieved by pain medicine. °· You develop a new onset of abdominal pain. °· You feel faint or pass out. °· You are unable to urinate. °  °This information is not intended to replace advice given to you by your health care provider. Make sure you discuss any questions you have with your health care provider. °  °Document Released: 11/03/2005 Document Revised: 07/25/2015 Document Reviewed: 04/06/2013 °Elsevier Interactive Patient Education ©2016 [employer] ° °

## 2015-10-02 NOTE — ED Notes (Signed)
Pt alert and oriented x4. Respirations even and unlabored, bilateral symmetrical rise and fall of chest. Skin warm and dry. In no acute distress. Denies needs.   

## 2015-10-02 NOTE — ED Notes (Signed)
[facility] at bedside
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
## 2015-10-02 NOTE — Progress Notes (Signed)
Patient refused Cpap machine, would rather be placed on O2. Neta Mends RN 9:50 PM  10-02-2015

## 2015-10-02 NOTE — Op Note (Signed)
Preoperative Diagnosis:  Acute Appendicitis  Postoprative Diagnosis:  Acute Appendicitis  Procedure: Procedure(s): APPENDECTOMY LAPAROSCOPIC   Surgeon: Excell Seltzer T   Assistants: none  Anesthesia:  General endotracheal anesthesia  Indications: patient is a 39 year old male who presents with 2-3 days of right flank pain. Workup included CT scan showing evidence of acute appendicitis and on exam he does have localized right lower quadrant tenderness. I have recommended proceeding with laparoscopic appendectomy. I discussed alternatives and the nature of the surgery and the risks detailed elsewhere and he agrees to proceed.    Procedure Detail: patient was brought to the operating room, placed in the supine position on the operating table, and general endotracheal anesthesia induced. He received preoperative IV antibiotics. PAS were in place. Foley catheter was placed. The abdomen was widely sterilely prepped and draped. Patient timeout was performed and correct procedure verified. Access was obtained with a 1-1/2 cm incision at the umbilicus with an open Hassan technique. There was a very small umbilical hernia that was dissected free and this opening is enlarged slightly and the Hassan trocar placed through a mattress suture of 0 Vicryl. Pneumoperitoneum was established. Under direct vision 5 mm trochars were placed in the right upper quadrant and the left lower quadrant. The appendix was visualized just inferior to the cecum and was significantly inflamed, thickened and whitish discolored without exudate or necrosis or perforation. The appendix was elevated and was mobilized off of some somewhat chronic adhesions to the terminal ileum mesentery. Lateral peritoneal attachments were divided mobilizing the cecum and base of the appendix. The mesial appendix was then sequentially divided with the Harmonic scalpel until the appendix was completely freed out of the tip of the cecum. The appendix  was removed at the tip of the cecum with a single firing of the Endo GIA 45 mm blue load stapler. The staple line was intact and without significant bleeding. The appendix was placed in an Endo Catch bag and brought out through the umbilical incision. The right lower quadrant was irrigated and hemostasis assured. No evidence of trocar injury or other problems. All CO2 was evacuated and trochars removed. The mattress suture was secured at the umbilicus. Skin incisions were closed with subcutaneous Monocryl and Dermabond. Sponge needle and instrument counts were correct.    Findings: Acute appendicitis without perforation or gangrene  Estimated Blood Loss:  Minimal         Drains: none  Blood Given: none          Specimens: appendix        Complications:  * No complications entered in OR log *         Disposition: PACU - hemodynamically stable.         Condition: stable

## 2015-10-02 NOTE — Transfer of Care (Signed)
Immediate Anesthesia Transfer of Care Note  Patient: Charles Crosby  Procedure(s) Performed: Procedure(s): APPENDECTOMY LAPAROSCOPIC (N/A)  Patient Location: PACU  Anesthesia Type:General  Level of Consciousness: awake, alert  and oriented  Airway & Oxygen Therapy: Patient Spontanous Breathing and Patient connected to face mask oxygen  Post-op Assessment: Report given to RN and Post -op Vital signs reviewed and stable  Post vital signs: Reviewed and stable  Last Vitals:  Filed Vitals:   10/02/15 1748  BP: 130/81  Pulse: 82  Temp: 37.6 C  Resp: 22    Complications: No apparent anesthesia complications

## 2015-10-02 NOTE — Anesthesia Preprocedure Evaluation (Signed)
Anesthesia Evaluation  Patient identified by MRN, date of birth, ID band Patient awake    Reviewed: Allergy & Precautions, NPO status , Patient's Chart, lab work & pertinent test results  History of Anesthesia Complications Negative for: history of anesthetic complications  Airway Mallampati: I  TM Distance: >3 FB Neck ROM: Full    Dental  (+) Teeth Intact, Dental Advisory Given   Pulmonary sleep apnea and Continuous Positive Airway Pressure Ventilation , former smoker,    Pulmonary exam normal breath sounds clear to auscultation       Cardiovascular Exercise Tolerance: Good (-) hypertension(-) angina(-) CAD and (-) Past MI negative cardio ROS Normal cardiovascular exam Rhythm:Regular Rate:Normal     Neuro/Psych PSYCHIATRIC DISORDERS (PTSD) negative neurological ROS     GI/Hepatic negative GI ROS, Neg liver ROS,   Endo/Other  negative endocrine ROS  Renal/GU negative Renal ROS     Musculoskeletal negative musculoskeletal ROS (+)   Abdominal   Peds  Hematology negative hematology ROS (+)   Anesthesia Other Findings Day of surgery medications reviewed with the patient.  Drank bottle of water at 1400.  Reproductive/Obstetrics                             Anesthesia Physical Anesthesia Plan  ASA: II  Anesthesia Plan: General   Post-op Pain Management:    Induction: Intravenous  Airway Management Planned: Oral ETT  Additional Equipment:   Intra-op Plan:   Post-operative Plan: Extubation in OR  Informed Consent: I have reviewed the patients History and Physical, chart, labs and discussed the procedure including the risks, benefits and alternatives for the proposed anesthesia with the patient or authorized representative who has indicated his/her understanding and acceptance.   Dental advisory given  Plan Discussed with: CRNA  Anesthesia Plan Comments: (Risks/benefits of  general anesthesia discussed with patient including risk of damage to teeth, lips, gum, and tongue, nausea/vomiting, allergic reactions to medications, and the possibility of heart attack, stroke and death.  All patient questions answered.  Patient wishes to proceed.)        Anesthesia Quick Evaluation

## 2015-10-03 ENCOUNTER — Telehealth: Payer: Self-pay | Admitting: Surgery

## 2015-10-03 ENCOUNTER — Encounter (HOSPITAL_COMMUNITY): Payer: Self-pay | Admitting: Student

## 2015-10-03 DIAGNOSIS — K3580 Unspecified acute appendicitis: Secondary | ICD-10-CM | POA: Diagnosis not present

## 2015-10-03 LAB — URINALYSIS
BILIRUBIN URINE: NEGATIVE
GLUCOSE, UA: NEGATIVE
Hgb urine dipstick: NEGATIVE
Nitrite: NEGATIVE
Protein, ur: NEGATIVE
SPECIFIC GRAVITY, URINE: 1.029 (ref 1.001–1.035)
pH: 6 (ref 5.0–8.0)

## 2015-10-03 LAB — CBC
HEMATOCRIT: 35.1 % — AB (ref 39.0–52.0)
Hemoglobin: 11.6 g/dL — ABNORMAL LOW (ref 13.0–17.0)
MCH: 30.5 pg (ref 26.0–34.0)
MCHC: 33 g/dL (ref 30.0–36.0)
MCV: 92.4 fL (ref 78.0–100.0)
Platelets: 170 10*3/uL (ref 150–400)
RBC: 3.8 MIL/uL — AB (ref 4.22–5.81)
RDW: 12.3 % (ref 11.5–15.5)
WBC: 3.9 10*3/uL — AB (ref 4.0–10.5)

## 2015-10-03 LAB — URINE CULTURE
Colony Count: NO GROWTH
Organism ID, Bacteria: NO GROWTH

## 2015-10-03 MED ORDER — METHOCARBAMOL 500 MG PO TABS: 1000.0000 mg | ORAL_TABLET | Freq: Three times a day (TID) | ORAL | Status: DC | PRN

## 2015-10-03 MED ORDER — OXYCODONE-ACETAMINOPHEN 5-325 MG PO TABS: 1.0000 | ORAL_TABLET | ORAL | Status: DC | PRN

## 2015-10-03 NOTE — Discharge Summary (Signed)
Physician Discharge Summary  Patient ID: LENDEL VALLETTA MRN: [id number] DOB/AGE: 39-Feb-1977 39 y.o.  Admit date: 10/02/2015 Discharge date: 10/03/2015  Admitting Diagnosis: Acute appendicitis   Discharge Diagnosis Patient Active Problem List   Diagnosis Date Noted  . Appendicitis, acute 10/02/2015  . Acute appendicitis 10/02/2015  . Groin injury 11/01/2014  . Traumatic hematoma of groin 11/01/2014  . Traumatic brain injury ([place]) 11/04/2013  . Post traumatic stress disorder (PTSD) 11/04/2013  . Laceration of middle finger of left hand without complication 99991111  . CONSTIPATION, CHRONIC 10/22/2009  . CHEST PAIN, LEFT 10/22/2009  . PERFORATED TYMPANIC MEMBRANE 03/30/2009  . TOBACCO DEPENDENCE 08/25/2006    Consultants none  Imaging: Ct Renal Stone Study  10/02/2015  CLINICAL DATA:  Right flank pain for 5 days, history of kidney stones previously EXAM: CT ABDOMEN AND PELVIS WITHOUT CONTRAST TECHNIQUE: Multidetector CT imaging of the abdomen and pelvis was performed following the standard protocol without IV contrast. COMPARISON:  None. FINDINGS: The lung bases are clear with minimal linear scarring in the lingula and right middle lobe. The liver is unremarkable in the unenhanced state. No calcified gallstones are seen. The pancreas is normal in size and the pancreatic duct is not dilated. The adrenal glands and spleen are unremarkable. The stomach is moderately distended with fluid with no abnormality noted. Scanning through the kidneys, no renal calculi are seen. There is no evidence of hydronephrosis. The proximal ureters are normal in caliber. The abdominal aorta is normal in caliber. Only small retroperitoneal and mesenteric lymph nodes are present. However, there is prominence of the appendix measuring up to 11 mm in diameter with very minimal periappendiceal stranding. These findings are consistent with early appendicitis. No complicating features are noted. The distal  ureters are normal in caliber. No distal ureteral calculus is seen. The urinary bladder is unremarkable. The prostate is normal in size. The terminal ileum is unremarkable. The lumbar vertebrae are in normal alignment with normal intervertebral disc spaces. IMPRESSION: 1. Distention of the appendix with mild periappendiceal strandiness most consistent with early appendicitis. No complicating features. 2. No renal or ureteral calculi are noted.  No hydronephrosis. Electronically Signed   By: Ivar Drape M.D.   On: 10/02/2015 12:06    Procedures Laparoscopic appendectomy---Dr. [place] Hospital Course:  Selinda Flavin presented to [facility] with abdominal pain.  Workup by PCP showed acute appendicitis on CT and he was therefore referred to the ed.  Patient was admitted and underwent procedure listed above.  Tolerated procedure well and was transferred to the floor.  Diet was advanced as tolerated.  On POD#1, the patient was voiding well, tolerating diet, ambulating well, pain well controlled, vital signs stable, incisions c/d/i and felt stable for discharge home. Medication risks, benefits and therapeutic alternatives were reviewed with the patient.  He verbalizes understanding.  Patient will follow up in our office in 2 weeks and knows to call with questions or concerns.  Physical Exam: General:  Alert, NAD, pleasant, comfortable Abd:  Soft, ND, mild tenderness, incisions C/D/I    Medication List    STOP taking these medications        HYDROcodone-acetaminophen 7.5-325 MG tablet  Commonly known as:  [place] these medications        AMBULATORY NON FORMULARY MEDICATION  Medication Name: CPAP with mask, tubing, and humidifier.  Dx OSA with AHI of 21.  Set to Autopap and then download after one week so we can set his  pressure. Range of auto pressure: 4-20 CmH2O.     buPROPion 150 MG 24 hr tablet  Commonly known as:  WELLBUTRIN XL  Take 300 mg by mouth daily.     cyclobenzaprine 10 MG  tablet  Commonly known as:  FLEXERIL  Take 10 mg by mouth 2 (two) times daily as needed for muscle spasms.     divalproex 500 MG 24 hr tablet  Commonly known as:  DEPAKOTE ER  Take 1,250 mg by mouth at bedtime.     FLUoxetine 40 MG capsule  Commonly known as:  PROZAC  Take 2 capsules (80 mg total) by mouth daily.     FLUoxetine 20 MG capsule  Commonly known as:  PROZAC  Take 60 mg by mouth daily.     FLUoxetine 10 MG capsule  Commonly known as:  PROZAC  Take 10 mg by mouth daily.     lamoTRIgine 25 MG tablet  Commonly known as:  LAMICTAL  Take 75 mg by mouth at bedtime.     NUEDEXTA 20-10 MG Caps  Generic drug:  Dextromethorphan-Quinidine  Take 1 capsule by mouth daily.     oxyCODONE-acetaminophen 5-325 MG tablet  Commonly known as:  PERCOCET/ROXICET  Take 1-2 tablets by mouth every 4 (four) hours as needed for moderate pain.             Follow-up Information    Follow up with LIEPINS, ANDY, PA-C On 10/17/2015.   Specialty:  Surgery   Why:  arrive by 11:45AM for a 12:15PM post operative check up at the office of central [facility] surgery   Contact information:   [address] [place] [address] [phone]       Signed: Erby Pian, [facility] [phone]  10/03/2015, 11:10 AM
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
## 2015-10-03 NOTE — Discharge Instructions (Signed)

## 2015-10-03 NOTE — Progress Notes (Signed)
Discharge instructions and prescriptions given to patient .  Questions answered 

## 2015-10-04 MED FILL — Medication: Qty: 1 | Status: CN

## 2015-10-10 MED FILL — Medication: Qty: 1 | Status: AC

## 2015-12-02 IMAGING — CT CT RENAL STONE PROTOCOL
3 of 4 series · 14 of 32 positions shown, 19 images · non-contrast
Comparison: None.

CLINICAL DATA: Right flank pain for 5 days, history of kidney
stones previously

EXAM:
CT ABDOMEN AND PELVIS WITHOUT CONTRAST
TECHNIQUE: Multidetector CT imaging of the abdomen and pelvis was performed
following the standard protocol without IV contrast.

[Series 2: abd/pelvis without · axial · non-contrast · 0.74mm/px · z∈[-429,-164]mm · 4 of 89 slices shown, 9 images]
[im 18/89  soft-tissue]
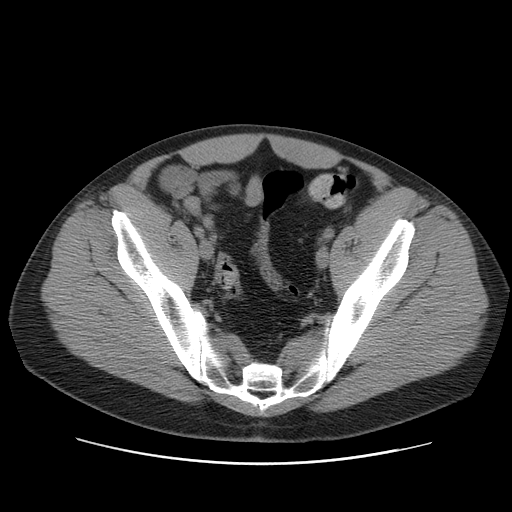
[im 18/89  lung]
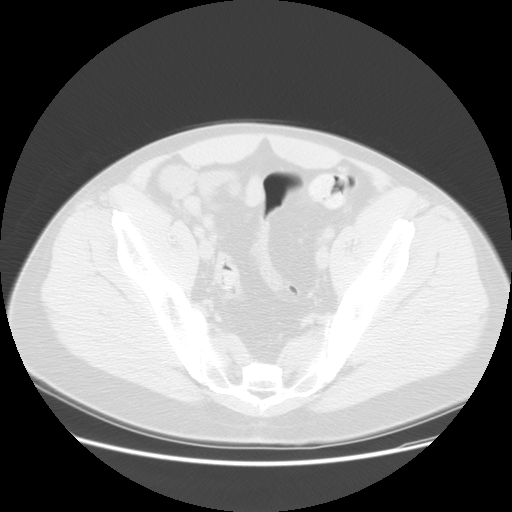
[im 18/89  bone]
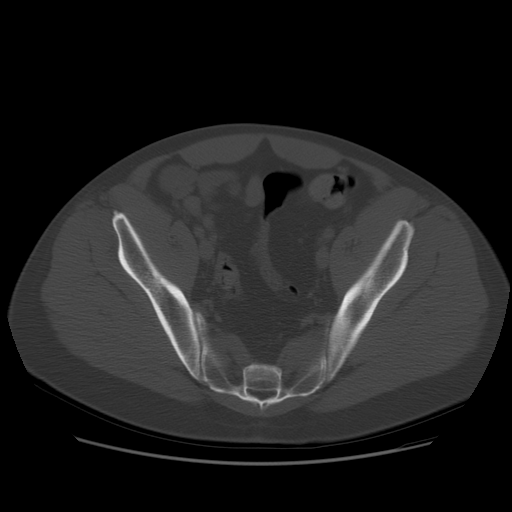
[im 36/89  soft-tissue]
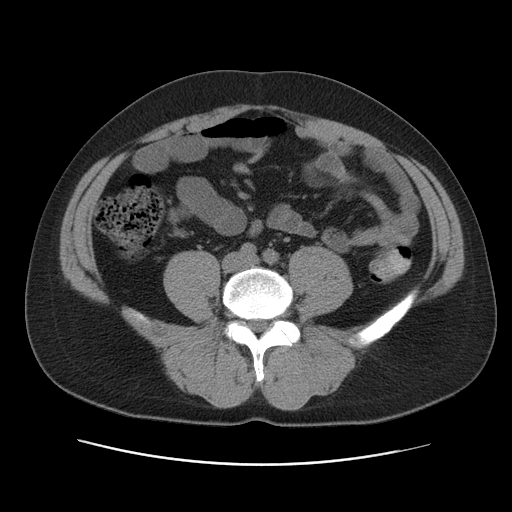
[im 36/89  lung]
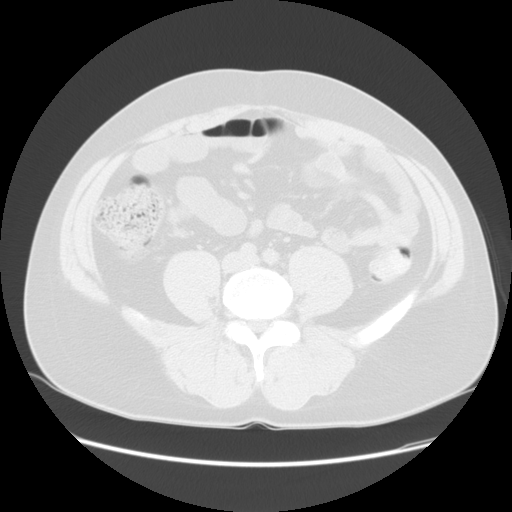
[im 53/89  soft-tissue]
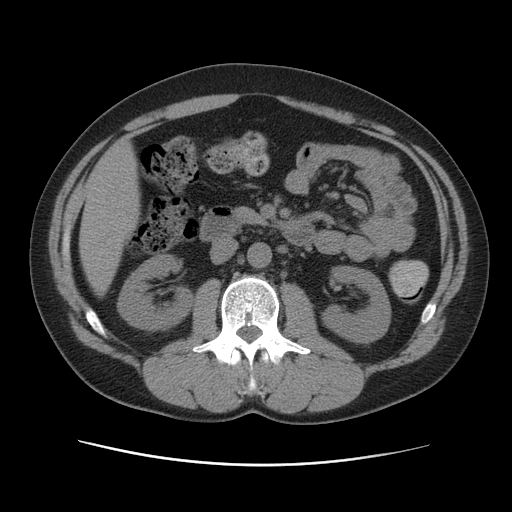
[im 53/89  lung]
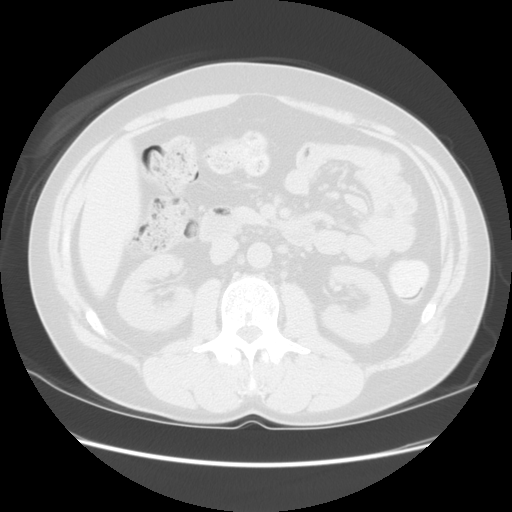
[im 71/89  soft-tissue]
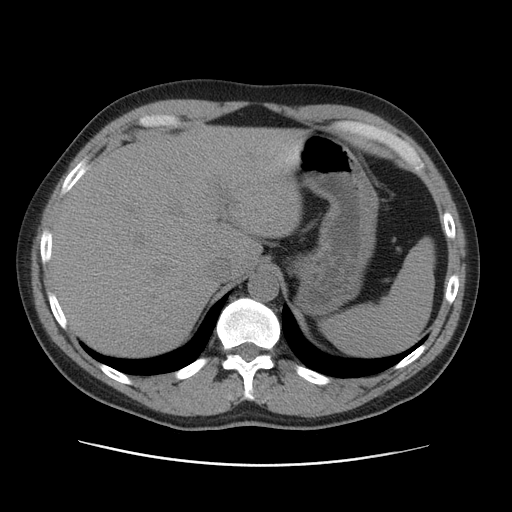
[im 71/89  lung]
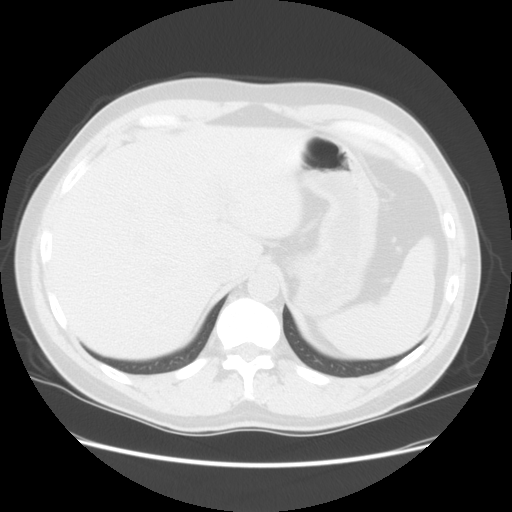

[Series 400: sag · sagittal · 0.95mm/px · 8 of 147 slices shown]
[im 14/147  soft-tissue]
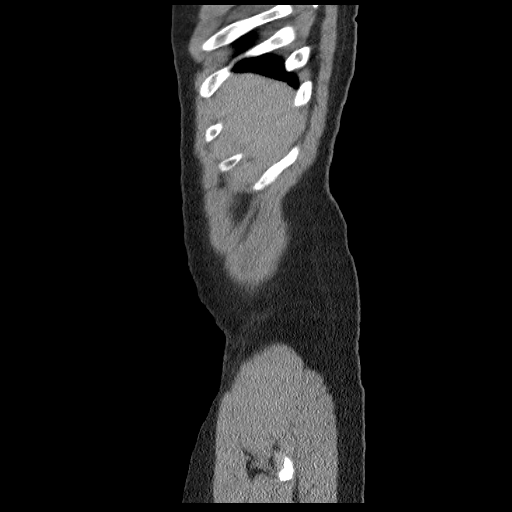
[im 27/147  soft-tissue]
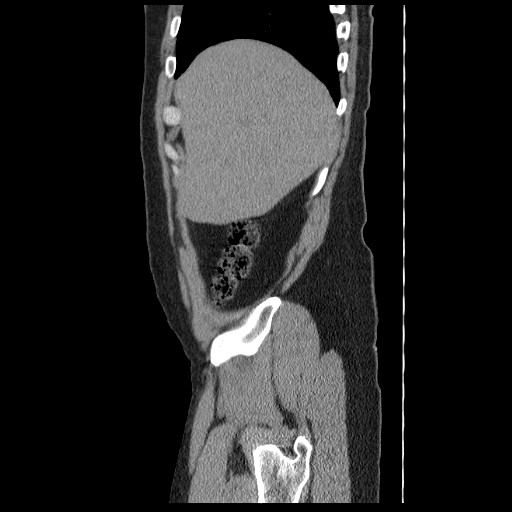
[im 54/147  soft-tissue]
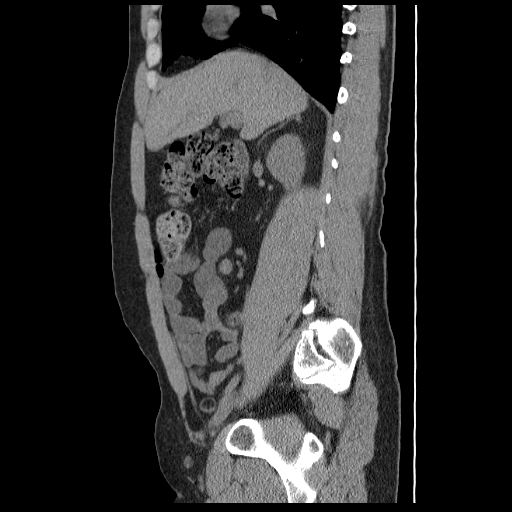
[im 67/147  soft-tissue]
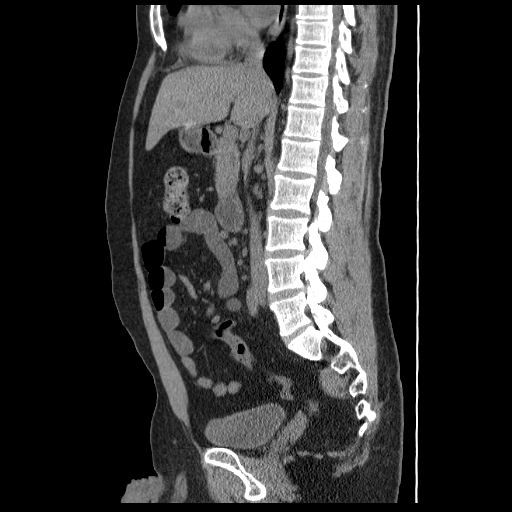
[im 80/147  soft-tissue]
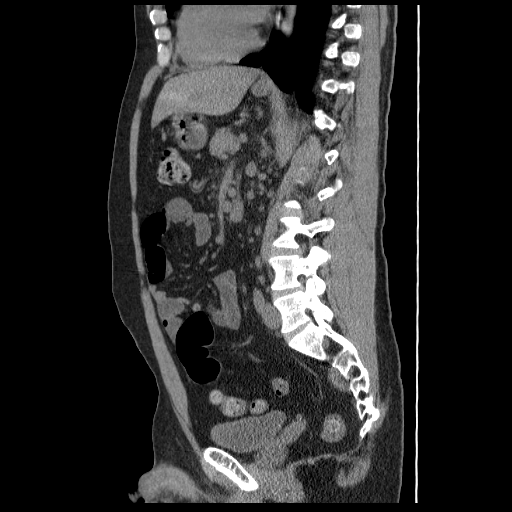
[im 93/147  soft-tissue]
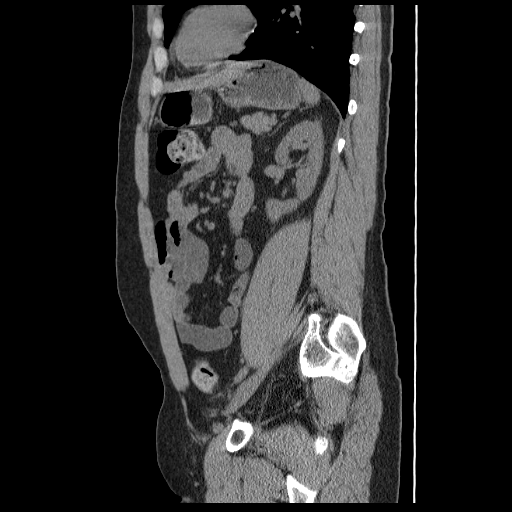
[im 120/147  soft-tissue]
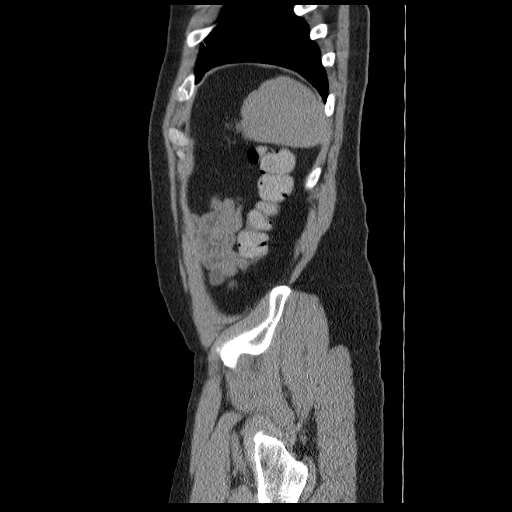
[im 133/147  soft-tissue]
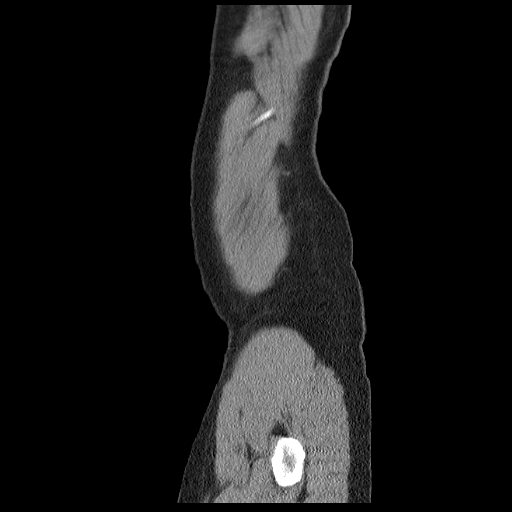

[Series 401: coronal · coronal · 0.95mm/px · 2 of 138 slices shown]
[im 14/138  soft-tissue]
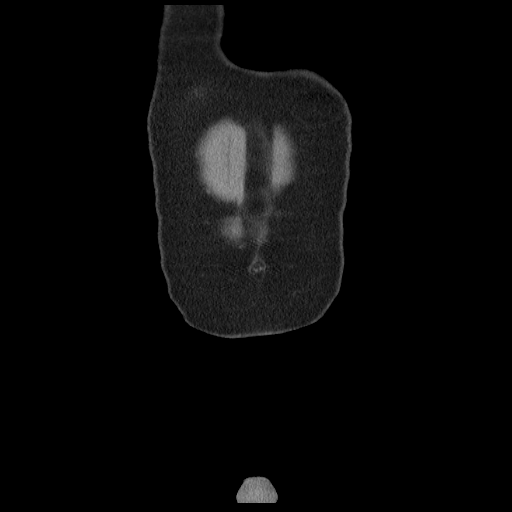
[im 28/138  soft-tissue]
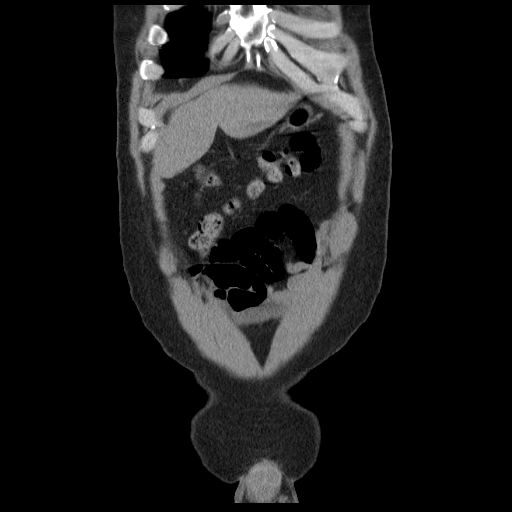

[14 of 32 positions shown; findings below may reference images not displayed]

FINDINGS: The lung bases are clear with minimal linear scarring in the lingula
and right middle lobe. The liver is unremarkable in the unenhanced
state. No calcified gallstones are seen. The pancreas is normal in
size and the pancreatic duct is not dilated. The adrenal glands and
spleen are unremarkable. The stomach is moderately distended with
fluid with no abnormality noted. Scanning through the kidneys, no
renal calculi are seen. There is no evidence of hydronephrosis. The
proximal ureters are normal in caliber. The abdominal aorta is
normal in caliber. Only small retroperitoneal and mesenteric lymph
nodes are present.

However, there is prominence of the appendix measuring up to 11 mm
in diameter with very minimal periappendiceal stranding. These
findings are consistent with early appendicitis. No complicating
features are noted.

The distal ureters are normal in caliber. No distal ureteral
calculus is seen. The urinary bladder is unremarkable. The prostate
is normal in size. The terminal ileum is unremarkable. The lumbar
vertebrae are in normal alignment with normal intervertebral disc
spaces.
IMPRESSION: 1. Distention of the appendix with mild periappendiceal strandiness
most consistent with early appendicitis. No complicating features.
2. No renal or ureteral calculi are noted.  No hydronephrosis.

## 2016-01-07 ENCOUNTER — Ambulatory Visit: Payer: Self-pay | Admitting: Family Medicine

## 2016-07-09 DIAGNOSIS — Z79899 Other long term (current) drug therapy: Secondary | ICD-10-CM | POA: Insufficient documentation

## 2016-08-04 ENCOUNTER — Encounter: Payer: Self-pay | Admitting: Family Medicine

## 2016-08-04 ENCOUNTER — Ambulatory Visit (INDEPENDENT_AMBULATORY_CARE_PROVIDER_SITE_OTHER): Payer: Federal, State, Local not specified - PPO | Admitting: Family Medicine

## 2016-08-04 VITALS — BP 117/70 | HR 86 | Wt 179.0 lb

## 2016-08-04 DIAGNOSIS — M549 Dorsalgia, unspecified: Secondary | ICD-10-CM | POA: Diagnosis not present

## 2016-08-04 MED ORDER — HYDROCODONE-ACETAMINOPHEN 5-325 MG PO TABS: 1.0000 | ORAL_TABLET | Freq: Every evening | ORAL | 0 refills | Status: DC | PRN

## 2016-08-04 MED ORDER — METAXALONE 400 MG PO TABS: 400.0000 mg | ORAL_TABLET | Freq: Three times a day (TID) | ORAL | 0 refills | Status: DC | PRN
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
## 2017-04-08 ENCOUNTER — Ambulatory Visit: Payer: Federal, State, Local not specified - PPO | Admitting: Family Medicine

## 2017-04-09 ENCOUNTER — Ambulatory Visit (INDEPENDENT_AMBULATORY_CARE_PROVIDER_SITE_OTHER): Payer: Federal, State, Local not specified - PPO | Admitting: Family Medicine

## 2017-04-09 ENCOUNTER — Encounter: Payer: Self-pay | Admitting: Family Medicine

## 2017-04-09 VITALS — BP 96/59 | HR 79 | Ht 71.0 in | Wt 190.0 lb

## 2017-04-09 DIAGNOSIS — N401 Enlarged prostate with lower urinary tract symptoms: Secondary | ICD-10-CM | POA: Diagnosis not present

## 2017-04-09 DIAGNOSIS — R350 Frequency of micturition: Secondary | ICD-10-CM | POA: Diagnosis not present

## 2017-04-09 MED ORDER — SOLIFENACIN SUCCINATE 5 MG PO TABS: 5.0000 mg | ORAL_TABLET | Freq: Every day | ORAL | 5 refills | Status: DC

## 2017-04-09 NOTE — Progress Notes (Signed)
   Subjective:    Patient ID: Charles Crosby, male    DOB: 1976/01/23, 41 y.o.   MRN: [id number]  HPI pt reports that he is being seen at the [facility] for this and was put on flomax 1 cap and about 2 wks ago he went back to the [place] and they increased the flomax to 2 caps and he feels that the sxs are the same he would like Dr. Gardiner Ramus opinion regarding this. He really hasn't noticed any significant improvement. His major concern is that he is having urinary frequency. He says he has to go urinate almost 2-3 times per hour. At night he actually does really well and only gets up about once per night. He is not having a lot of problems with urinary hesitancy. He did have an ultrasound done at the [place] with a confirmed enlarged prostate. He doesn't know the details of the report.    Review of Systems     Objective:   Physical Exam  Constitutional: He is oriented to person, place, and time. He appears well-developed and well-nourished.  HENT:  Head: Normocephalic and atraumatic.  Eyes: Conjunctivae and EOM are normal.  Cardiovascular: Normal rate.   Pulmonary/Chest: Effort normal.  Neurological: He is alert and oriented to person, place, and time.  Skin: Skin is dry. No pallor.  Psychiatric: He has a normal mood and affect. His behavior is normal.  Vitals reviewed.         Assessment & Plan:  BPH with urinary frequency-New Dx. actually think he would respond better to an anti-cholinergic such as Vesicare or Detrol. We also discussed that we could consider also adding a 5 alpha reductase inhibitor to help with the volume of the prostate but does typically take 6-12 months to work. I think it's worth trying Vesicare for at least a couple of months to see if it's effective. Will start with 5 mg and can increase to 10 mg if needed. If not helpful then consider Avodart.

## 2017-04-27 ENCOUNTER — Encounter: Payer: Self-pay | Admitting: Family Medicine

## 2017-04-27 ENCOUNTER — Ambulatory Visit (INDEPENDENT_AMBULATORY_CARE_PROVIDER_SITE_OTHER): Payer: Federal, State, Local not specified - PPO | Admitting: Family Medicine

## 2017-04-27 ENCOUNTER — Ambulatory Visit (INDEPENDENT_AMBULATORY_CARE_PROVIDER_SITE_OTHER): Payer: Federal, State, Local not specified - PPO

## 2017-04-27 VITALS — BP 102/54 | HR 72 | Ht 69.69 in | Wt 185.0 lb

## 2017-04-27 DIAGNOSIS — R0602 Shortness of breath: Secondary | ICD-10-CM | POA: Diagnosis not present

## 2017-04-27 DIAGNOSIS — R221 Localized swelling, mass and lump, neck: Secondary | ICD-10-CM

## 2017-04-27 LAB — CBC WITH DIFFERENTIAL/PLATELET
BASOS ABS: 54 {cells}/uL (ref 0–200)
BASOS PCT: 1 %
EOS ABS: 270 {cells}/uL (ref 15–500)
EOS PCT: 5 %
HCT: 43.6 % (ref 38.5–50.0)
HEMOGLOBIN: 14.4 g/dL (ref 13.2–17.1)
LYMPHS ABS: 1728 {cells}/uL (ref 850–3900)
Lymphocytes Relative: 32 %
MCH: 30.4 pg (ref 27.0–33.0)
MCHC: 33 g/dL (ref 32.0–36.0)
MCV: 92 fL (ref 80.0–100.0)
MONOS PCT: 7 %
MPV: 11.1 fL (ref 7.5–12.5)
Monocytes Absolute: 378 cells/uL (ref 200–950)
NEUTROS ABS: 2970 {cells}/uL (ref 1500–7800)
Neutrophils Relative %: 55 %
PLATELETS: 259 10*3/uL (ref 140–400)
RBC: 4.74 MIL/uL (ref 4.20–5.80)
RDW: 12.2 % (ref 11.0–15.0)
WBC: 5.4 10*3/uL (ref 3.8–10.8)

## 2017-04-27 LAB — TSH: TSH: 0.47 m[IU]/L (ref 0.40–4.50)

## 2017-04-27 MED ORDER — ALBUTEROL SULFATE (2.5 MG/3ML) 0.083% IN NEBU: 2.5000 mg | INHALATION_SOLUTION | Freq: Once | RESPIRATORY_TRACT | Status: DC
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
## 2017-04-28 NOTE — Addendum Note (Signed)
Addended by: Teddy Spike on: 04/28/2017 05:47 PM   Modules accepted: Orders

## 2017-05-06 ENCOUNTER — Encounter: Payer: Self-pay | Admitting: Sports Medicine

## 2017-05-06 ENCOUNTER — Ambulatory Visit (INDEPENDENT_AMBULATORY_CARE_PROVIDER_SITE_OTHER): Payer: Federal, State, Local not specified - PPO | Admitting: Sports Medicine

## 2017-05-06 DIAGNOSIS — H60332 Swimmer's ear, left ear: Secondary | ICD-10-CM

## 2017-05-06 MED ORDER — CIPROFLOXACIN-DEXAMETHASONE 0.3-0.1 % OT SUSP: 4.0000 [drp] | Freq: Two times a day (BID) | OTIC | 0 refills | Status: AC

## 2017-05-06 NOTE — Assessment & Plan Note (Signed)
Tympanic membrane is intact. Adding Ciprodex, return to see me in 2 weeks to make sure things have improved.

## 2017-05-06 NOTE — Patient Instructions (Signed)
Otitis Externa Otitis externa is an infection of the outer ear canal. The outer ear canal is the area between the outside of the ear and the eardrum. Otitis externa is sometimes called "swimmer's ear." What are the causes? This condition may be caused by:  Swimming in dirty water.  Moisture in the ear.  An injury to the inside of the ear.  An object stuck in the ear.  A cut or scrape on the outside of the ear.  What increases the risk? This condition is more likely to develop in swimmers. What are the signs or symptoms? The first symptom of this condition is often itching in the ear. Later signs and symptoms include:  Swelling of the ear.  Redness in the ear.  Ear pain. The pain may get worse when you pull on your ear.  Pus coming from the ear.  How is this diagnosed? This condition may be diagnosed by examining the ear and testing fluid from the ear for bacteria and funguses. How is this treated? This condition may be treated with:  Antibiotic ear drops. These are often given for 10-14 days.  Medicine to reduce itching and swelling.  Follow these instructions at home:  If you were prescribed antibiotic ear drops, apply them as told by your health care provider. Do not stop using the antibiotic even if your condition improves.  Take over-the-counter and prescription medicines only as told by your health care provider.  Keep all follow-up visits as told by your health care provider. This is important. How is this prevented?  Keep your ear dry. Use the corner of a towel to dry your ear after you swim or bathe.  Avoid scratching or putting things in your ear. Doing these things can damage the ear canal or remove the protective wax that lines it, which makes it easier for bacteria and funguses to grow.  Avoid swimming in lakes, polluted water, or pools that may not have the right amount of chlorine.  Consider making ear drops and putting 3 or 4 drops in each ear after  you swim. Ask your health care provider about how you can make ear drops. Contact a health care provider if:  You have a fever.  After 3 days your ear is still red, swollen, painful, or draining pus.  Your redness, swelling, or pain gets worse.  You have a severe headache.  You have redness, swelling, pain, or tenderness in the area behind your ear. This information is not intended to replace advice given to you by your health care provider. Make sure you discuss any questions you have with your health care provider. Document Released: 11/03/2005 Document Revised: 12/11/2015 Document Reviewed: 08/13/2015 Elsevier Interactive Patient Education  2018 [employer] Inc.  

## 2017-05-06 NOTE — Progress Notes (Signed)
  Subjective:    CC: Left ear pain  HPI: This is a pleasant 41 year old male, he went swimming recently, it some diving, subsequently he developed severe pain in his left ear with yellowish and reddish discharge. Now he has severe pain on the left side of his head, persistent without radiation. No constitutional symptoms.   Past medical history:  Negative.  See flowsheet/record as well for more information.  Surgical history: Negative.  See flowsheet/record as well for more information.  Family history: Negative.  See flowsheet/record as well for more information.  Social history: Negative.  See flowsheet/record as well for more information.  Allergies, and medications have been entered into the medical record, reviewed, and no changes needed.   Review of Systems: No fevers, chills, night sweats, weight loss, chest pain, or shortness of breath.   Objective:    General: Well Developed, well nourished, and in no acute distress.  Neuro: Alert and oriented x3, extra-ocular muscles intact, sensation grossly intact.  HEENT: Normocephalic, atraumatic, pupils equal round reactive to light, neck supple, no masses, no lymphadenopathy, thyroid nonpalpable. Oropharynx, nasopharynx unremarkable, left ear canal shows an intact tympanic membrane, canal is erythematous and edematous. Right side is unremarkable. Skin: Warm and dry, no rashes. Cardiac: Regular rate and rhythm, no murmurs rubs or gallops, no lower extremity edema.  Respiratory: Clear to auscultation bilaterally. Not using accessory muscles, speaking in full sentences.  Impression and Recommendations:    Otitis externa, left Tympanic membrane is intact. Adding Ciprodex, return to see me in 2 weeks to make sure things have improved.

## 2017-05-21 ENCOUNTER — Ambulatory Visit: Payer: Self-pay | Admitting: Sports Medicine

## 2017-05-21 DIAGNOSIS — Z0189 Encounter for other specified special examinations: Secondary | ICD-10-CM

## 2017-06-22 ENCOUNTER — Ambulatory Visit (INDEPENDENT_AMBULATORY_CARE_PROVIDER_SITE_OTHER): Payer: Federal, State, Local not specified - PPO | Admitting: Family Medicine

## 2017-06-22 ENCOUNTER — Other Ambulatory Visit: Payer: Self-pay | Admitting: Family Medicine

## 2017-06-22 ENCOUNTER — Ambulatory Visit (INDEPENDENT_AMBULATORY_CARE_PROVIDER_SITE_OTHER): Payer: Federal, State, Local not specified - PPO

## 2017-06-22 ENCOUNTER — Encounter: Payer: Self-pay | Admitting: Family Medicine

## 2017-06-22 VITALS — BP 105/68 | HR 66 | Wt 187.0 lb

## 2017-06-22 DIAGNOSIS — M545 Low back pain, unspecified: Secondary | ICD-10-CM

## 2017-06-22 MED ORDER — KETOROLAC TROMETHAMINE 60 MG/2ML IM SOLN
60.0000 mg | Freq: Once | INTRAMUSCULAR | Status: AC
  Administered 2017-06-22: 60 mg via INTRAMUSCULAR

## 2017-06-22 MED ORDER — PREDNISONE 20 MG PO TABS: 40.0000 mg | ORAL_TABLET | Freq: Every day | ORAL | 0 refills | Status: DC
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
## 2017-06-29 ENCOUNTER — Ambulatory Visit: Payer: Self-pay | Admitting: Physician Assistant

## 2017-06-30 ENCOUNTER — Ambulatory Visit: Payer: Self-pay | Admitting: Rehabilitative and Restorative Service Providers"

## 2018-03-30 ENCOUNTER — Ambulatory Visit: Payer: Self-pay | Admitting: Physician Assistant

## 2018-04-05 ENCOUNTER — Encounter: Payer: Self-pay | Admitting: Physician Assistant

## 2018-04-05 ENCOUNTER — Ambulatory Visit (INDEPENDENT_AMBULATORY_CARE_PROVIDER_SITE_OTHER): Payer: Federal, State, Local not specified - PPO | Admitting: Physician Assistant

## 2018-04-05 VITALS — BP 120/59 | HR 85 | Temp 97.9°F | Wt 199.0 lb

## 2018-04-05 DIAGNOSIS — R0789 Other chest pain: Secondary | ICD-10-CM | POA: Diagnosis not present

## 2018-04-05 DIAGNOSIS — E291 Testicular hypofunction: Secondary | ICD-10-CM | POA: Diagnosis not present

## 2018-04-05 DIAGNOSIS — Z8739 Personal history of other diseases of the musculoskeletal system and connective tissue: Secondary | ICD-10-CM

## 2018-04-05 MED ORDER — IBUPROFEN 800 MG PO TABS: 800.0000 mg | ORAL_TABLET | Freq: Three times a day (TID) | ORAL | 2 refills | Status: DC | PRN

## 2018-04-05 NOTE — Progress Notes (Signed)
Subjective:    Patient ID: Charles Crosby, male    DOB: Jan 17, 1976, 42 y.o.   MRN: [id number]  HPI  42 y/o pleasant male patient presents for evaluation of intermittent chest pain. This has been going on for the past several weeks. He has a history of similar chest pain several months ago, which was diagnosed as rhabdomyolysis. He was hospitalized for IV fluids. He currently does not work as he is retired, he exercises every morning which normally includes 30 minutes of strength training and 30 minutes of cardio which is usually running. He states that he never gets the chest pain with running, however he will feel winded or short of breath when walking up one flight of stairs at his home. The chest pain is described as a pressure feeling or feeling of heaviness that is located on the left side of his chest inferior to his pectoralis muscle. He does not experience palpitations with the chest pain. Rest does not alleviate the chest pressure, although "rubbing it out" with his hand over his chest will often alleviate the pain. Nothing seems to make the pain worse. It is not affected by eating. He does have a 20 year smoking history, but quit 10 years ago.   He was also started on injectable testosterone 2 months ago by [facility], where his wife works.   .. Active Ambulatory Problems    Diagnosis Date Noted  . TOBACCO DEPENDENCE 08/25/2006  . CONSTIPATION, CHRONIC 10/22/2009  . Atypical chest pain 10/22/2009  . 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 Post traumatic stress disorder (PTSD) 11/04/2013  . Benign prostatic hyperplasia with urinary frequency 04/09/2017  . Chest pressure 04/05/2018  . History of rhabdomyolysis 04/05/2018  . Male hypogonadism 04/05/2018   Resolved Ambulatory Problems    Diagnosis Date Noted  . CERUMEN IMPACTED 08/25/2006  . Otitis externa, left 03/30/2009  . Acute pharyngitis 02/07/2009  . PNEUMONIA, ATYPICAL 02/07/2009  . TMJ PAIN  06/18/2009  . Laceration of middle finger of left hand without complication 79/39/0300  . Groin injury 11/01/2014  . Traumatic hematoma of groin 11/01/2014  . Appendicitis, acute 10/02/2015  . Acute appendicitis 10/02/2015   Past Medical History:  Diagnosis Date  . Alcohol dependency ([place])   . Constipation, chronic   . Hearing loss   . PTSD (post-traumatic stress disorder)   . Traumatic brain injury [facility])      Review of Systems  All other systems reviewed and are negative.      Objective:   Physical Exam  Constitutional: He is oriented to person, place, and time. He appears well-developed and well-nourished.  HENT:  Head: Normocephalic and atraumatic.  Eyes: Pupils are equal, round, and reactive to light. EOM are normal.  Cardiovascular: Normal rate and regular rhythm.  Pulmonary/Chest: Effort normal and breath sounds normal. No respiratory distress.  Abdominal: Soft. He exhibits no distension and no mass. There is no tenderness.  Neurological: He is alert and oriented to person, place, and time.  Skin: Skin is warm and dry.  Psychiatric: He has a normal mood and affect. His behavior is normal. His mood appears not anxious.  Vitals reviewed.     Assessment & Plan:  .Marland KitchenMaksym was seen today for chest pain.  Diagnoses and all orders for this visit:  Chest pressure -     EKG 12-Lead -     CK -     CBC with Differential/Platelet -     Fe+TIBC+Fer -  Troponin I -     ibuprofen (ADVIL,MOTRIN) 800 MG tablet; Take 1 tablet (800 mg total) by mouth every 8 (eight) hours as needed. -     COMPLETE METABOLIC PANEL WITH GFR  Atypical chest pain -     CK -     CBC with Differential/Platelet -     Fe+TIBC+Fer -     Troponin I -     ibuprofen (ADVIL,MOTRIN) 800 MG tablet; Take 1 tablet (800 mg total) by mouth every 8 (eight) hours as needed. -     COMPLETE METABOLIC PANEL WITH GFR  History of rhabdomyolysis -     CK  Male hypogonadism  EKG performed in office. Normal  sinus rhythm. No ST segment changes. P wave present with regular PR intervals.  Will obtain labs to evaluate for possible rhabdomyolysis, anemia, hemochromatosis, cardiac ischemia. Advised patient to take 800 mg ibuprofen up to 3x/day in order to decrease any inflammation from a potential pectoralis muscle strain. Stop all weight lifting and cardio for now. Drink plenty of water. Will call with lab results tomorrow. Stress test to consider in future.   Hypogonadism to be managed by Charles Crosby.

## 2018-04-05 NOTE — Patient Instructions (Signed)
Rhabdomyolysis Rhabdomyolysis is a condition that happens when muscle cells break down and release substances into the blood that can damage the kidneys. This condition happens because of damage to the muscles that move bones (skeletal muscle). When the skeletal muscles are damaged, substances inside the muscle cells go into the blood. One of these substances is a protein called myoglobin. Large amounts of myoglobin can cause kidney damage or kidney failure. Other substances that are released by muscle cells may upset the balance of the minerals (electrolytes) in your blood. This imbalance causes your blood to have too much acid (acidosis). What are the causes? This condition is caused by muscle damage. Muscle damage often happens because of:  Using your muscles too much.  An injury that crushes or squeezes a muscle too tightly.  Using illegal drugs, mainly cocaine.  Alcohol abuse.  Other possible causes include:  Prescription medicines, such as those that: ? Lower cholesterol (statins). ? Treat ADHD (attention deficit hyperactivity disorder) or help with weight loss (amphetamines). ? Treat pain (opiates).  Infections.  Muscle diseases that are passed down from parent to child (inherited).  High fever.  Heatstroke.  Not having enough fluids in your body (dehydration).  Seizures.  Surgery.  What increases the risk? This condition is more likely to develop in people who:  Have a family history of muscle disease.  Take part in extreme sports, such as running in marathons.  Have diabetes.  Are older.  Abuse drugs or alcohol.  What are the signs or symptoms? Symptoms of this condition vary. Some people have very few symptoms, and other people have many symptoms. The most common symptoms include:  Muscle pain and swelling.  Weak muscles.  Dark urine.  Feeling weak and tired.  Other symptoms include:  Nausea and vomiting.  Fever.  Pain in the abdomen.  Pain  in the joints.  Symptoms of complications from this condition include:  Heart rhythm that is not normal (arrhythmia).  Seizures.  Not urinating enough because of kidney failure.  Very low blood pressure (shock). Signs of shock include dizziness, blurry vision, and clammy skin.  Bleeding that is hard to stop or control.  How is this diagnosed? This condition is diagnosed based on your medical history, your symptoms, and a physical exam. Tests may also be done, including:  Blood tests.  Urine tests to check for myoglobin.  You may also have other tests to check for causes of muscle damage and to check for complications. How is this treated? Treatment for this condition helps to:  Make sure you have enough fluids in your body.  Lower the acid levels in your blood to reverse acidosis.  Protect your kidneys.  Treatment may include:  Fluids and medicines given through an IV tube that is inserted into one of your veins.  Medicines to lower acidosis or to bring back the balance of the minerals in your body.  Hemodialysis. This treatment uses an artificial kidney machine to filter your blood while you recover. You may have this if other treatments are not helping.  Follow these instructions at home:  Take over-the-counter and prescription medicines only as told by your health care provider.  Rest at home until your health care provider says that you can return to your normal activities.  Drink enough fluid to keep your urine clear or pale yellow.  Do not do activities that take a lot of effort (are strenuous). Ask your health care provider what level of exercise is safe   for you.  Do not abuse drugs or alcohol. If you are having problems with drug or alcohol use, ask your health care provider for help.  Keep all follow-up visits as told by your health care provider. This is important. Contact a health care provider if:  You start having symptoms of this condition after  treatment. Get help right away if:  You have a seizure.  You bleed easily or cannot control bleeding.  You cannot urinate.  You have chest pain.  You have trouble breathing. This information is not intended to replace advice given to you by your health care provider. Make sure you discuss any questions you have with your health care provider. Document Released: 10/16/2004 Document Revised: 08/15/2016 Document Reviewed: 08/15/2016 Elsevier Interactive Patient Education  2018 Elsevier Inc.  

## 2018-04-05 NOTE — Progress Notes (Deleted)
   Subjective:    Patient ID: HONEST VANLEER, male    DOB: 17-Jan-1976, 42 y.o.   MRN: [id number]  HPI [facility] blood work   States "he fills like he can rub it out"   Testosterone started 2 months. 2 weeks of CP. No stress no anxiety. Retire.  Go to gym in the morning more pronouned in am. "heaviness and tightness not effective by food.   Run a mile and no CP. Home and walk up 2 flights of stairs and get winded.   Smoked 10-20 years. Stopped 10 years.   Work out cardio 30 lifting 30.    Hx of rhabdomyos straight water  Better- resolve on its on.  Worse-   Review of Systems     Objective:   Physical Exam        Assessment & Plan:

## 2018-04-06 ENCOUNTER — Ambulatory Visit: Payer: Self-pay | Admitting: Family Medicine

## 2018-04-06 LAB — COMPLETE METABOLIC PANEL WITH GFR
AG Ratio: 1.6 (calc) (ref 1.0–2.5)
ALBUMIN MSPROF: 4.1 g/dL (ref 3.6–5.1)
ALKALINE PHOSPHATASE (APISO): 51 U/L (ref 40–115)
ALT: 16 U/L (ref 9–46)
AST: 34 U/L (ref 10–40)
BUN: 11 mg/dL (ref 7–25)
CO2: 30 mmol/L (ref 20–32)
CREATININE: 1.34 mg/dL (ref 0.60–1.35)
Calcium: 9.4 mg/dL (ref 8.6–10.3)
Chloride: 101 mmol/L (ref 98–110)
GFR, Est African American: 76 mL/min/{1.73_m2} (ref >=60)
GFR, Est Non African American: 65 mL/min/{1.73_m2} (ref >=60)
GLUCOSE: 76 mg/dL (ref 65–99)
Globulin: 2.6 g/dL (calc) (ref 1.9–3.7)
Potassium: 4.1 mmol/L (ref 3.5–5.3)
Sodium: 137 mmol/L (ref 135–146)
Total Bilirubin: 0.4 mg/dL (ref 0.2–1.2)
Total Protein: 6.7 g/dL (ref 6.1–8.1)

## 2018-04-06 LAB — CBC WITH DIFFERENTIAL/PLATELET
BASOS PCT: 0.9 %
Basophils Absolute: 72 cells/uL (ref 0–200)
EOS ABS: 352 {cells}/uL (ref 15–500)
Eosinophils Relative: 4.4 %
HCT: 46.3 % (ref 38.5–50.0)
HEMOGLOBIN: 15.6 g/dL (ref 13.2–17.1)
Lymphs Abs: 1144 cells/uL (ref 850–3900)
MCH: 29.8 pg (ref 27.0–33.0)
MCHC: 33.7 g/dL (ref 32.0–36.0)
MCV: 88.4 fL (ref 80.0–100.0)
MONOS PCT: 7 %
MPV: 11.1 fL (ref 7.5–12.5)
NEUTROS ABS: 5872 {cells}/uL (ref 1500–7800)
Neutrophils Relative %: 73.4 %
PLATELETS: 278 10*3/uL (ref 140–400)
RBC: 5.24 10*6/uL (ref 4.20–5.80)
RDW: 12.7 % (ref 11.0–15.0)
TOTAL LYMPHOCYTE: 14.3 %
WBC mixed population: 560 cells/uL (ref 200–950)
WBC: 8 10*3/uL (ref 3.8–10.8)

## 2018-04-06 LAB — IRON,TIBC AND FERRITIN PANEL
%SAT: 30 % (ref 15–60)
Ferritin: 23 ng/mL (ref 20–380)
Iron: 120 ug/dL (ref 50–180)
TIBC: 396 mcg/dL (calc) (ref 250–425)

## 2018-04-06 LAB — TROPONIN I

## 2018-04-06 LAB — CK: Total CK: 711 U/L — ABNORMAL HIGH (ref 44–196)

## 2018-04-06 NOTE — Progress Notes (Signed)
Call pt: CK is elevated out of normal range but not as high as rhabdomyolysis. Troponin cardiac enzyme is negative. Electrolytes look good. I do think your pain is musculoskeletal. Treatment plan stays the same.

## 2018-04-27 DIAGNOSIS — R7989 Other specified abnormal findings of blood chemistry: Secondary | ICD-10-CM | POA: Insufficient documentation

## 2018-09-22 ENCOUNTER — Encounter: Payer: Self-pay | Admitting: Family Medicine

## 2018-09-22 ENCOUNTER — Ambulatory Visit (INDEPENDENT_AMBULATORY_CARE_PROVIDER_SITE_OTHER): Payer: Federal, State, Local not specified - PPO | Admitting: Family Medicine

## 2018-09-22 VITALS — BP 116/61 | HR 87 | Ht 69.69 in | Wt 187.0 lb

## 2018-09-22 DIAGNOSIS — R110 Nausea: Secondary | ICD-10-CM | POA: Diagnosis not present

## 2018-09-22 DIAGNOSIS — R6881 Early satiety: Secondary | ICD-10-CM | POA: Diagnosis not present

## 2018-09-22 MED ORDER — ESOMEPRAZOLE MAGNESIUM 40 MG PO PACK: 40.0000 mg | PACK | Freq: Every day | ORAL | 1 refills | Status: DC
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
## 2018-09-22 NOTE — Patient Instructions (Signed)
If you are not feeling better on the Nexium by Monday then please give me a call back so we can schedule you for an ultrasound of your gallbladder.  Just make sure to take the Nexium about 20 minutes before your first meal of the day to help turn the acid pumps off in your stomach.

## 2018-09-23 LAB — H. PYLORI BREATH TEST: H. pylori Breath Test: NOT DETECTED

## 2018-09-23 MED ORDER — ESOMEPRAZOLE MAGNESIUM 40 MG PO CPDR: 40.0000 mg | DELAYED_RELEASE_CAPSULE | Freq: Every day | ORAL | 0 refills | Status: DC

## 2018-09-23 NOTE — Addendum Note (Signed)
Addended by: Teddy Spike on: 09/23/2018 04:57 PM   Modules accepted: Orders

## 2018-12-14 ENCOUNTER — Ambulatory Visit (INDEPENDENT_AMBULATORY_CARE_PROVIDER_SITE_OTHER): Payer: Federal, State, Local not specified - PPO | Admitting: Family Medicine

## 2018-12-14 ENCOUNTER — Encounter: Payer: Self-pay | Admitting: Family Medicine

## 2018-12-14 VITALS — BP 111/66 | HR 85 | Ht 71.0 in | Wt 190.0 lb

## 2018-12-14 DIAGNOSIS — M79641 Pain in right hand: Secondary | ICD-10-CM

## 2018-12-14 MED ORDER — DICLOFENAC SODIUM 1 % TD GEL: 2.0000 g | Freq: Four times a day (QID) | TRANSDERMAL | 11 refills | Status: DC

## 2018-12-14 MED ORDER — PREDNISONE 50 MG PO TABS: 50.0000 mg | ORAL_TABLET | Freq: Every day | ORAL | 0 refills | Status: DC

## 2018-12-14 NOTE — Patient Instructions (Signed)
Instructions written as computer malfunction prevented typical after visit summary.
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
## 2018-12-16 ENCOUNTER — Ambulatory Visit: Payer: Federal, State, Local not specified - PPO | Admitting: Sports Medicine

## 2019-01-01 ENCOUNTER — Other Ambulatory Visit: Payer: Self-pay | Admitting: Family Medicine

## 2019-01-01 DIAGNOSIS — R110 Nausea: Secondary | ICD-10-CM

## 2019-01-03 ENCOUNTER — Other Ambulatory Visit: Payer: Self-pay | Admitting: *Deleted

## 2019-01-27 ENCOUNTER — Other Ambulatory Visit: Payer: Self-pay | Admitting: Family Medicine

## 2019-01-27 ENCOUNTER — Encounter: Payer: Self-pay | Admitting: Family Medicine

## 2019-01-27 DIAGNOSIS — R110 Nausea: Secondary | ICD-10-CM

## 2019-02-04 ENCOUNTER — Other Ambulatory Visit: Payer: Self-pay

## 2019-02-08 ENCOUNTER — Telehealth: Payer: Self-pay | Admitting: Family Medicine

## 2019-02-08 DIAGNOSIS — R110 Nausea: Secondary | ICD-10-CM

## 2019-02-08 NOTE — Telephone Encounter (Signed)
[place] ordered as routine. OK to wait? Routing.

## 2019-02-08 NOTE — Telephone Encounter (Signed)
Let try to get him in this week.

## 2019-02-08 NOTE — Telephone Encounter (Signed)
Order updated

## 2019-03-04 ENCOUNTER — Ambulatory Visit (INDEPENDENT_AMBULATORY_CARE_PROVIDER_SITE_OTHER): Payer: Federal, State, Local not specified - PPO

## 2019-03-04 ENCOUNTER — Other Ambulatory Visit: Payer: Self-pay

## 2019-03-04 DIAGNOSIS — R110 Nausea: Secondary | ICD-10-CM

## 2019-03-08 ENCOUNTER — Other Ambulatory Visit: Payer: Self-pay | Admitting: *Deleted

## 2019-03-08 DIAGNOSIS — R110 Nausea: Secondary | ICD-10-CM

## 2019-03-08 DIAGNOSIS — D135 Benign neoplasm of extrahepatic bile ducts: Secondary | ICD-10-CM

## 2019-03-17 DIAGNOSIS — R1013 Epigastric pain: Secondary | ICD-10-CM | POA: Insufficient documentation

## 2019-03-17 DIAGNOSIS — R112 Nausea with vomiting, unspecified: Secondary | ICD-10-CM | POA: Insufficient documentation

## 2019-03-30 LAB — HM COLONOSCOPY

## 2019-04-28 ENCOUNTER — Encounter (HOSPITAL_COMMUNITY): Payer: Self-pay | Admitting: Emergency Medicine

## 2019-04-28 ENCOUNTER — Other Ambulatory Visit: Payer: Self-pay

## 2019-04-28 ENCOUNTER — Emergency Department (HOSPITAL_COMMUNITY)
Admission: EM | Admit: 2019-04-28 | Discharge: 2019-04-30 | Disposition: A | Discharge status: Other Acute Inpatient Hospital | Payer: No Typology Code available for payment source | Attending: Emergency Medicine | Admitting: Emergency Medicine

## 2019-04-28 ENCOUNTER — Ambulatory Visit (HOSPITAL_COMMUNITY)
Admission: RE | Admit: 2019-04-28 | Discharge: 2019-04-28 | Disposition: A | Discharge status: Home / Self Care | Payer: No Typology Code available for payment source | Source: Home / Self Care | Attending: Psychiatry | Admitting: Psychiatry

## 2019-04-28 DIAGNOSIS — Z885 Allergy status to narcotic agent status: Secondary | ICD-10-CM | POA: Insufficient documentation

## 2019-04-28 DIAGNOSIS — F1120 Opioid dependence, uncomplicated: Secondary | ICD-10-CM | POA: Insufficient documentation

## 2019-04-28 DIAGNOSIS — Z87820 Personal history of traumatic brain injury: Secondary | ICD-10-CM | POA: Insufficient documentation

## 2019-04-28 DIAGNOSIS — Z79899 Other long term (current) drug therapy: Secondary | ICD-10-CM | POA: Diagnosis not present

## 2019-04-28 DIAGNOSIS — R45851 Suicidal ideations: Secondary | ICD-10-CM | POA: Diagnosis not present

## 2019-04-28 DIAGNOSIS — F329 Major depressive disorder, single episode, unspecified: Secondary | ICD-10-CM | POA: Diagnosis not present

## 2019-04-28 DIAGNOSIS — Z8249 Family history of ischemic heart disease and other diseases of the circulatory system: Secondary | ICD-10-CM | POA: Insufficient documentation

## 2019-04-28 DIAGNOSIS — Z8349 Family history of other endocrine, nutritional and metabolic diseases: Secondary | ICD-10-CM | POA: Insufficient documentation

## 2019-04-28 DIAGNOSIS — Z20828 Contact with and (suspected) exposure to other viral communicable diseases: Secondary | ICD-10-CM | POA: Insufficient documentation

## 2019-04-28 DIAGNOSIS — Z87891 Personal history of nicotine dependence: Secondary | ICD-10-CM | POA: Insufficient documentation

## 2019-04-28 DIAGNOSIS — Z0289 Encounter for other administrative examinations: Secondary | ICD-10-CM | POA: Diagnosis present

## 2019-04-28 DIAGNOSIS — F332 Major depressive disorder, recurrent severe without psychotic features: Secondary | ICD-10-CM | POA: Insufficient documentation

## 2019-04-28 DIAGNOSIS — F1020 Alcohol dependence, uncomplicated: Secondary | ICD-10-CM | POA: Insufficient documentation

## 2019-04-28 DIAGNOSIS — F32A Depression, unspecified: Secondary | ICD-10-CM

## 2019-04-28 LAB — RAPID URINE DRUG SCREEN, HOSP PERFORMED
Amphetamines: NOT DETECTED
Barbiturates: NOT DETECTED
Benzodiazepines: NOT DETECTED
Cocaine: NOT DETECTED
Opiates: NOT DETECTED
Tetrahydrocannabinol: NOT DETECTED

## 2019-04-28 LAB — COMPREHENSIVE METABOLIC PANEL
ALT: 21 U/L (ref 0–44)
AST: 23 U/L (ref 15–41)
Albumin: 4.3 g/dL (ref 3.5–5.0)
Alkaline Phosphatase: 68 U/L (ref 38–126)
Anion gap: 10 (ref 5–15)
BUN: 8 mg/dL (ref 6–20)
CO2: 27 mmol/L (ref 22–32)
Calcium: 8.9 mg/dL (ref 8.9–10.3)
Chloride: 101 mmol/L (ref 98–111)
Creatinine, Ser: 0.84 mg/dL (ref 0.61–1.24)
GFR calc Af Amer: >60 mL/min (ref >60)
GFR calc non Af Amer: >60 mL/min (ref >60)
Glucose, Bld: 106 mg/dL — ABNORMAL HIGH (ref 70–99)
Potassium: 3.5 mmol/L (ref 3.5–5.1)
Sodium: 138 mmol/L (ref 135–145)
Total Bilirubin: 0.3 mg/dL (ref 0.3–1.2)
Total Protein: 7.5 g/dL (ref 6.5–8.1)

## 2019-04-28 LAB — CBC WITH DIFFERENTIAL/PLATELET
Abs Immature Granulocytes: 0.04 10*3/uL (ref 0.00–0.07)
Basophils Absolute: 0.1 10*3/uL (ref 0.0–0.1)
Basophils Relative: 1 %
Eosinophils Absolute: 0.3 10*3/uL (ref 0.0–0.5)
Eosinophils Relative: 4 %
HCT: 45.1 % (ref 39.0–52.0)
Hemoglobin: 15.4 g/dL (ref 13.0–17.0)
Immature Granulocytes: 1 %
Lymphocytes Relative: 27 %
Lymphs Abs: 2.1 10*3/uL (ref 0.7–4.0)
MCH: 30.7 pg (ref 26.0–34.0)
MCHC: 34.1 g/dL (ref 30.0–36.0)
MCV: 89.8 fL (ref 80.0–100.0)
Monocytes Absolute: 0.4 10*3/uL (ref 0.1–1.0)
Monocytes Relative: 5 %
Neutro Abs: 5 10*3/uL (ref 1.7–7.7)
Neutrophils Relative %: 62 %
Platelets: 290 10*3/uL (ref 150–400)
RBC: 5.02 MIL/uL (ref 4.22–5.81)
RDW: 11.5 % (ref 11.5–15.5)
WBC: 7.9 10*3/uL (ref 4.0–10.5)
nRBC: 0 % (ref 0.0–0.2)

## 2019-04-28 LAB — ACETAMINOPHEN LEVEL: Acetaminophen (Tylenol), Serum: <10 ug/mL — ABNORMAL LOW (ref 10–30)

## 2019-04-28 LAB — ETHANOL: Alcohol, Ethyl (B): 244 mg/dL — ABNORMAL HIGH (ref <10)

## 2019-04-28 LAB — SALICYLATE LEVEL: Salicylate Lvl: <7 mg/dL (ref 2.8–30.0)

## 2019-04-28 MED ORDER — ZIPRASIDONE HCL 20 MG PO CAPS
80.0000 mg | ORAL_CAPSULE | Freq: Every evening | ORAL | Status: DC
  Administered 2019-04-29: 80 mg via ORAL
  Filled 2019-04-28: qty 4

## 2019-04-28 MED ORDER — LAMOTRIGINE 100 MG PO TABS
100.0000 mg | ORAL_TABLET | Freq: Every evening | ORAL | Status: DC
  Administered 2019-04-29: 100 mg via ORAL
  Filled 2019-04-28: qty 1

## 2019-04-28 MED ORDER — ESCITALOPRAM OXALATE 10 MG PO TABS
20.0000 mg | ORAL_TABLET | Freq: Every day | ORAL | Status: DC
  Administered 2019-04-29 – 2019-04-30 (×2): 20 mg via ORAL
  Filled 2019-04-28 (×2): qty 2

## 2019-04-28 NOTE — ED Notes (Signed)
Pt removed gown, monitoring equipment and put on his clothes. Then he went to the bathroom.
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
## 2019-04-28 NOTE — ED Notes (Signed)
EDP at bedside  

## 2019-04-28 NOTE — ED Triage Notes (Signed)
Pt here via [place], friend brought him to [facility] bc pt reported suicidal thoughts to him, denies SI with EMS and has been cooperative. [place] on board.  Nystagmus and slurred present, ambulatory with some gait imbalance. Wife reports possibility of taking 10 xanax before going to [facility], pt denies, no lethargy and GCS 15.
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
## 2019-04-28 NOTE — Progress Notes (Signed)
Pt is recommended for inpt tx by Dr. Jake Samples, MD. Pt states he does not want to be admitted VOL. Pt's wife Vittorio Mohs states she is concerned with his safety and willing to IVC pt due to hx of suicidal statements made by the pt and recent OD of xanax mixed with alcohol per wife report. TTS consulted with Lindon Romp, NP and Dr. Jake Samples, MD who recommends the pt be transferred to [facility] for medical clearance due to the suspected OD and IVC'd for inpt tx if the pt refuses to stay VOL. TTS spoke with charge nurse Santiago Glad, RN at [facility] to advise of pt's status.   Lind Covert, MSW, LCSW Therapeutic Triage Specialist  [phone]

## 2019-04-28 NOTE — H&P (Signed)
Behavioral Health Medical Screening Exam  Charles Crosby is an 43 y.o. male who presented to [facility] as a walk in. Patient states that he came in today because "I was feeling down." At this time patient denies SI, HI, AVH, substance abuse. States that he occasionally drinks alcohol. Denies that he has been taking xanax. Wife indicates that the patient has been drinking daily and that his mood is always depressed and flat.   Wife reports that the patient has a history of PTSD. States that his psychiatrist recent;y moved and he was assigned a new psychiatrist and she does not feel that he has opened up to the new psychiatrist.    Per wife, it appears that patient took a bottle of xanax today. States that bottle was for #10. The prescription was filled yesterday and the bottle is empty. Last prescription for Xanax per PMP Aware was 02/22/19 xanax 0.5 mg #10.  Case discussed with Dr. Johnn Hai who is familiar with the patient. Dr. Jake Samples feels that the patient is minimizing symptoms and recommends inpatient treatment after medical clearance.    Total Time spent with patient: 30 minutes  Psychiatric Specialty Exam: Physical Exam  Constitutional: He is oriented to person, place, and time. He appears well-developed. No distress.  HENT:  Head: Normocephalic and atraumatic.  Right Ear: External ear normal.  Left Ear: External ear normal.  Eyes: Pupils are equal, round, and reactive to light. Right eye exhibits no discharge. Left eye exhibits no discharge.  Respiratory: Effort normal. No respiratory distress.  Musculoskeletal: Normal range of motion.  Neurological: He is alert and oriented to person, place, and time.  Skin: He is not diaphoretic.  Psychiatric: His affect is blunt. His speech is slurred. He is withdrawn.    Review of Systems  Unable to perform ROS: Mental acuity  Psychiatric/Behavioral: Positive for depression and substance abuse. The patient is nervous/anxious.     Blood pressure  113/85, pulse 85, temperature 98.3 F (36.8 C), temperature source Oral, resp. rate 18, SpO2 99 %.There is no height or weight on file to calculate BMI.  General Appearance: Casual and Fairly Groomed  Eye Contact:  Minimal  Speech:  Slurred  Volume:  Decreased  Mood:  Patient states that he is fine  Affect:  Flat  Orientation:  Full (Time, Place, and Person)  Suicidal Thoughts:  Denies  Homicidal Thoughts:  Denies     Blood pressure 113/85, pulse 85, temperature 98.3 F (36.8 C), temperature source Oral, resp. rate 18, SpO2 99 %.  Recommendations:  Based on my evaluation the patient appears to have an emergency medical condition for which I recommend the patient be transferred to the emergency department for further evaluation.    Rozetta Nunnery, NP 04/28/2019, 9:47 PM

## 2019-04-29 ENCOUNTER — Other Ambulatory Visit: Payer: Self-pay

## 2019-04-29 LAB — SARS CORONAVIRUS 2: SARS Coronavirus 2: NOT DETECTED

## 2019-04-29 MED ORDER — NICOTINE 21 MG/24HR TD PT24
21.0000 mg | MEDICATED_PATCH | Freq: Once | TRANSDERMAL | Status: DC
  Administered 2019-04-29: 21 mg via TRANSDERMAL
  Filled 2019-04-29: qty 1

## 2019-04-29 NOTE — ED Notes (Signed)
Pt is getting irritable and wants to leave.  Bed is not available at this time.  Will continue to monitor patient.

## 2019-04-29 NOTE — ED Notes (Signed)
Pt is much more calm and cooperative after talking to his wife.  IV removed from left antecube.

## 2019-04-29 NOTE — ED Notes (Signed)
Copies of IVC made. One to medical records. 3 copies on chart. Original to red folder in pyxis room. Copy already faxed to [facility].

## 2019-04-29 NOTE — Progress Notes (Signed)
Pt accepted to [facility]; room 406-1 Marvia Pickles, NP is the accepting provider.   Dr. Parke Poisson is the attending provider.   Call report to [phone]   [facility] @ [facility] ED notified.    Pt is involuntary and will be transported by law enforcement Pt is scheduled to arrive at [facility] at [place], [place], [place] Disposition [facility]/TTS [phone]

## 2019-04-29 NOTE — ED Notes (Signed)
Pt moved from room 22 to purple #50. Pt dressed in burgundy scrubs. Cooperative. ++ ETOH. Placed in [facility] for transport. Able to walk to restroom without difficulty upon arrival to unit.

## 2019-04-29 NOTE — ED Notes (Signed)
Pt

## 2019-04-29 NOTE — ED Notes (Signed)
Pt denies S/I and H/I.  He states he was drunk when he was saying he was S/I.  He is calm and cooperative at this time.

## 2019-04-29 NOTE — ED Provider Notes (Signed)
Pt is IVCed.  Pt awaiting placement.  Pt resting at time of evaluation this am.  Vitals stable.    Fransico Meadow, [place] 04/29/19 [id number]    Sherwood Gambler, MD 04/30/19 [phone]

## 2019-04-29 NOTE — ED Notes (Signed)
Belongings in locker #2 

## 2019-04-29 NOTE — ED Notes (Signed)
Pt.belongings

## 2019-04-29 NOTE — ED Notes (Signed)
Breakfast tray ordered 

## 2019-04-30 ENCOUNTER — Other Ambulatory Visit: Payer: Self-pay

## 2019-04-30 ENCOUNTER — Inpatient Hospital Stay (HOSPITAL_COMMUNITY)
Admission: AD | Admit: 2019-04-30 | Discharge: 2019-05-02 | DRG: 885 | Disposition: A | Discharge status: Home / Self Care | Payer: Federal, State, Local not specified - PPO | Attending: Psychiatry | Admitting: Psychiatry

## 2019-04-30 ENCOUNTER — Encounter (HOSPITAL_COMMUNITY): Payer: Self-pay | Admitting: Emergency Medicine

## 2019-04-30 DIAGNOSIS — F1099 Alcohol use, unspecified with unspecified alcohol-induced disorder: Secondary | ICD-10-CM | POA: Diagnosis not present

## 2019-04-30 DIAGNOSIS — Z87820 Personal history of traumatic brain injury: Secondary | ICD-10-CM | POA: Diagnosis not present

## 2019-04-30 DIAGNOSIS — Z87891 Personal history of nicotine dependence: Secondary | ICD-10-CM | POA: Diagnosis not present

## 2019-04-30 DIAGNOSIS — F329 Major depressive disorder, single episode, unspecified: Secondary | ICD-10-CM | POA: Diagnosis not present

## 2019-04-30 DIAGNOSIS — F4310 Post-traumatic stress disorder, unspecified: Secondary | ICD-10-CM | POA: Diagnosis not present

## 2019-04-30 DIAGNOSIS — Z79899 Other long term (current) drug therapy: Secondary | ICD-10-CM

## 2019-04-30 DIAGNOSIS — F1010 Alcohol abuse, uncomplicated: Secondary | ICD-10-CM | POA: Diagnosis present

## 2019-04-30 DIAGNOSIS — F332 Major depressive disorder, recurrent severe without psychotic features: Secondary | ICD-10-CM | POA: Diagnosis present

## 2019-04-30 DIAGNOSIS — F4000 Agoraphobia, unspecified: Secondary | ICD-10-CM | POA: Diagnosis present

## 2019-04-30 DIAGNOSIS — H9190 Unspecified hearing loss, unspecified ear: Secondary | ICD-10-CM | POA: Diagnosis present

## 2019-04-30 DIAGNOSIS — F4312 Post-traumatic stress disorder, chronic: Secondary | ICD-10-CM | POA: Diagnosis present

## 2019-04-30 MED ORDER — HYDROXYZINE HCL 25 MG PO TABS
25.0000 mg | ORAL_TABLET | Freq: Four times a day (QID) | ORAL | Status: DC | PRN
  Administered 2019-05-01: 21:00:00 25 mg via ORAL
  Filled 2019-04-30: qty 1

## 2019-04-30 MED ORDER — ZIPRASIDONE HCL 80 MG PO CAPS
80.0000 mg | ORAL_CAPSULE | Freq: Every evening | ORAL | Status: DC
  Administered 2019-04-30 – 2019-05-01 (×2): 80 mg via ORAL
  Filled 2019-04-30 (×3): qty 1

## 2019-04-30 MED ORDER — THIAMINE HCL 100 MG/ML IJ SOLN: 100.0000 mg | Freq: Once | INTRAMUSCULAR | Status: DC

## 2019-04-30 MED ORDER — ESCITALOPRAM OXALATE 20 MG PO TABS
20.0000 mg | ORAL_TABLET | Freq: Every day | ORAL | Status: DC
  Administered 2019-05-01 – 2019-05-02 (×2): 20 mg via ORAL
  Filled 2019-04-30 (×4): qty 1

## 2019-04-30 MED ORDER — NICOTINE 21 MG/24HR TD PT24
21.0000 mg | MEDICATED_PATCH | Freq: Once | TRANSDERMAL | Status: DC
  Administered 2019-04-30: 21 mg via TRANSDERMAL
  Filled 2019-04-30: qty 1

## 2019-04-30 MED ORDER — ADULT MULTIVITAMIN W/MINERALS CH
1.0000 | ORAL_TABLET | Freq: Every day | ORAL | Status: DC
  Administered 2019-05-01 – 2019-05-02 (×2): 1 via ORAL
  Filled 2019-04-30 (×4): qty 1

## 2019-04-30 MED ORDER — CHLORDIAZEPOXIDE HCL 25 MG PO CAPS: 25.0000 mg | ORAL_CAPSULE | Freq: Every day | ORAL | Status: DC

## 2019-04-30 MED ORDER — LOPERAMIDE HCL 2 MG PO CAPS: 2.0000 mg | ORAL_CAPSULE | ORAL | Status: DC | PRN

## 2019-04-30 MED ORDER — TRAZODONE HCL 50 MG PO TABS: 50.0000 mg | ORAL_TABLET | Freq: Every evening | ORAL | Status: DC | PRN

## 2019-04-30 MED ORDER — CHLORDIAZEPOXIDE HCL 25 MG PO CAPS: 25.0000 mg | ORAL_CAPSULE | Freq: Three times a day (TID) | ORAL | Status: DC

## 2019-04-30 MED ORDER — ONDANSETRON 4 MG PO TBDP: 4.0000 mg | ORAL_TABLET | Freq: Four times a day (QID) | ORAL | Status: DC | PRN

## 2019-04-30 MED ORDER — CHLORDIAZEPOXIDE HCL 25 MG PO CAPS: 25.0000 mg | ORAL_CAPSULE | ORAL | Status: DC

## 2019-04-30 MED ORDER — LAMOTRIGINE 100 MG PO TABS
100.0000 mg | ORAL_TABLET | Freq: Every evening | ORAL | Status: DC
  Administered 2019-04-30 – 2019-05-01 (×2): 100 mg via ORAL
  Filled 2019-04-30 (×4): qty 1

## 2019-04-30 MED ORDER — ACETAMINOPHEN 325 MG PO TABS: 650.0000 mg | ORAL_TABLET | Freq: Four times a day (QID) | ORAL | Status: DC | PRN

## 2019-04-30 MED ORDER — NICOTINE 21 MG/24HR TD PT24
21.0000 mg | MEDICATED_PATCH | Freq: Every day | TRANSDERMAL | Status: DC
  Administered 2019-04-30 – 2019-05-02 (×3): 21 mg via TRANSDERMAL
  Filled 2019-04-30 (×5): qty 1

## 2019-04-30 MED ORDER — CHLORDIAZEPOXIDE HCL 25 MG PO CAPS: 25.0000 mg | ORAL_CAPSULE | Freq: Four times a day (QID) | ORAL | Status: DC

## 2019-04-30 MED ORDER — HYDROXYZINE HCL 25 MG PO TABS: 25.0000 mg | ORAL_TABLET | Freq: Three times a day (TID) | ORAL | Status: DC | PRN

## 2019-04-30 MED ORDER — CHLORDIAZEPOXIDE HCL 25 MG PO CAPS: 25.0000 mg | ORAL_CAPSULE | Freq: Four times a day (QID) | ORAL | Status: DC | PRN

## 2019-04-30 MED ORDER — ALUM & MAG HYDROXIDE-SIMETH 200-200-20 MG/5ML PO SUSP: 30.0000 mL | ORAL | Status: DC | PRN

## 2019-04-30 MED ORDER — MAGNESIUM HYDROXIDE 400 MG/5ML PO SUSP: 30.0000 mL | Freq: Every day | ORAL | Status: DC | PRN

## 2019-04-30 MED ORDER — VITAMIN B-1 100 MG PO TABS
100.0000 mg | ORAL_TABLET | Freq: Every day | ORAL | Status: DC
  Administered 2019-05-01 – 2019-05-02 (×2): 100 mg via ORAL
  Filled 2019-04-30 (×3): qty 1

## 2019-04-30 NOTE — ED Notes (Signed)
Attempted to call report to [facility].  Staff passing meds at this time.  Will call back later.

## 2019-04-30 NOTE — B[facility] Suicide Risk Assessment (Signed)
[facility] Admission Suicide Risk Assessment   Nursing information obtained from:  Patient Demographic factors:  Male, Caucasian Current Mental Status:  Self-harm behaviors Loss Factors:  NA Historical Factors:  Impulsivity Risk Reduction Factors:  Responsible for children under 43 years of age, Living with another person, especially a relative  Total Time spent with patient: 45 minutes Principal Problem:  PTSD by history, Depression, Unspecified, consider Alcohol Induced , Alcohol Use Disorder Diagnosis:  Active Problems:   MDD (major depressive disorder), recurrent severe, without psychosis ([place])  Subjective Data:   Continued Clinical Symptoms:  Alcohol Use Disorder Identification Test Final Score (AUDIT): 24 The "Alcohol Use Disorders Identification Test", Guidelines for Use in Primary Care, Second Edition.  World Pharmacologist [facility]). Score between 0-7:  no or low risk or alcohol related problems. Score between 8-15:  moderate risk of alcohol related problems. Score between 16-19:  high risk of alcohol related problems. Score 20 or above:  warrants further diagnostic evaluation for alcohol dependence and treatment.   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 Presented to hospital for acute depression related to increased alcohol consumption. Reports past history of alcohol abuse, more recently alcohol consumption had decreased, drinking a few times a week. He does acknowledge he drank heavily on day of admission. Admission BAL 244. As per chart there was concern patient had also overdosed on Xanax as an empty bottle was found, but patient denies and UDS was negative for BZDs ( and other substances). Patient currently denies significant depression or neuro-vegetative symptoms, does endorse chronic PTSD symptoms. Reports he has generally been doing well on combination of Geodon, Lamictal, Lexapro.    Psychiatric Specialty Exam: Physical  Exam  ROS  Blood pressure (!) 120/91, pulse 71, temperature 99.2 F (37.3 C), temperature source Oral, resp. rate 16, height 5\' 11"  (1.803 m), weight 83.9 kg, SpO2 99 %.Body mass index is 25.8 kg/m.  See admit note MSE                                                        COGNITIVE FEATURES THAT CONTRIBUTE TO RISK:  Closed-mindedness and Loss of executive function    SUICIDE RISK:   Moderate:  Frequent suicidal ideation with limited intensity, and duration, some specificity in terms of plans, no associated intent, good self-control, limited dysphoria/symptomatology, some risk factors present, and identifiable protective factors, including available and accessible social support.  PLAN OF CARE: Patient will be admitted to inpatient psychiatric unit for stabilization and safety. Will provide and encourage milieu participation. Provide medication management and maked adjustments as needed.  Will also provide medication management to address potential WDL if needed- Will follow daily.    I certify that inpatient services furnished can reasonably be expected to improve the patient's condition.   Jenne Campus, MD 04/30/2019, 3:13 PM

## 2019-04-30 NOTE — Progress Notes (Signed)
Patient observed lying in bed resting but not asleep. He reports this being his first admit here. He reports that his day has been good so far. Writer informed him of medications available if needed. He currently reports that he does not need anything for sleep and will try sleeping on his on. Support given and safety maintained on unit with 15 min checks.

## 2019-04-30 NOTE — ED Notes (Signed)
Report called to [facility].  [facility] contacted for transport.

## 2019-04-30 NOTE — ED Provider Notes (Signed)
Pt pending placement with [facility]. Has a bed available (per chart review as of 830 pm last night, but talking to RN it was supposed to be as of 830 am today).   Labs, vitals, and RN notes reviewed. Pt given nicotine patch.    Franchot Heidelberg, PA-C 04/30/19 9675    Julianne Rice, MD 04/30/19 1145
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
## 2019-04-30 NOTE — Plan of Care (Signed)
  Problem: Education: Goal: Knowledge of  General Education information/materials will improve Outcome: Progressing Goal: Verbalization of understanding the information provided will improve Outcome: Progressing   

## 2019-04-30 NOTE — Tx Team (Signed)
Initial Treatment Plan 04/30/2019 11:55 AM [name] [id number]    PATIENT STRESSORS: Substance abuse Other: "shame of having PTSD"   PATIENT STRENGTHS: Average or above average intelligence Communication skills General fund of knowledge Supportive family/friends   PATIENT IDENTIFIED PROBLEMS: "Substance abuse"  "Anxiety"  "PTSD"                 DISCHARGE CRITERIA:  Improved stabilization in mood, thinking, and/or behavior Reduction of life-threatening or endangering symptoms to within safe limits Verbal commitment to aftercare and medication compliance Withdrawal symptoms are absent or subacute and managed without 24-hour nursing intervention  PRELIMINARY DISCHARGE PLAN: Outpatient therapy Return to previous living arrangement  PATIENT/FAMILY INVOLVEMENT: This treatment plan has been presented to and reviewed with the patient, Charles Crosby, and/or family member.  The patient and family have been given the opportunity to ask questions and make suggestions.  Harriet Masson, RN 04/30/2019, 11:55 AM

## 2019-04-30 NOTE — Progress Notes (Signed)
Patient ID: Charles Crosby, male   DOB: [birth year], 43 y.o.   MRN: [id number]  [place] NOVEL CORONAVIRUS (COVID-19) DAILY CHECK-OFF SYMPTOMS - answer yes or no to each - every day NO YES  Have you had a fever in the past 24 hours?  . Fever (Temp > 37.80C / 100F) X   Have you had any of these symptoms in the past 24 hours? . New Cough .  Sore Throat  .  Shortness of Breath .  Difficulty Breathing .  Unexplained Body Aches   X   Have you had any one of these symptoms in the past 24 hours not related to allergies?   . Runny Nose .  Nasal Congestion .  Sneezing   X   If you have had runny nose, nasal congestion, sneezing in the past 24 hours, has it worsened?  X   EXPOSURES - check yes or no X   Have you traveled outside the state in the past 14 days?  X   Have you been in contact with someone with a confirmed diagnosis of COVID-19 or PUI in the past 14 days without wearing appropriate PPE?  X   Have you been living in the same home as a person with confirmed diagnosis of COVID-19 or a PUI (household contact)?    X   Have you been diagnosed with COVID-19?    X              What to do next: Answered NO to all: Answered YES to anything:   Proceed with unit schedule Follow the [facility] Inpatient Flowsheet.

## 2019-04-30 NOTE — Progress Notes (Signed)
Patient ID: Charles Crosby, male   DOB: 30-Apr-1976, 43 y.o.   MRN: [id number]   D: Pt alert and oriented during [facility] admission process. Pt denies SI/HI, A/VH, and any pain. Pt is cooperative.  "Charles Crosby is an 43 y.o. male who presents to [facility] as a walk-in voluntarily BIB a friend. Pt states he was referred to [facility] by Dr. Jake Samples, MD who contacted [facility] to advise of the pt's hx. Pt is vague throughout the assessment. When asked why he came to [facility] he states "I just felt bad." Pt denies SI to this Probation officer. Pt endorses alcohol use and states he uses occasionally. Pt provided verbal consent for TTS to speak with his wife. Wife, Charles Crosby, states the pt is minimizing his substance abuse. Pt's wife state the pt picked up a prescription of Xanax at 3pm today (10 pills) and the bottle is empty. Pt's wife reports she believes the pt took the medication and consumed large amounts of alcohol. Pt denies this and states he consumes alcohol 2-3 times a week only, however his wife reports he drinks daily. Pt reportedly has a hx of PTSD due to being in the [employer]. Pt's wife reports the pt has been withdrawn at home, flat affect, and constantly tells her he wants to give up. Wife reports the pt told a friend that he has to fight the urge to commit suicide daily. Pt has been admitted to [facility] and [facility] hospital in the past due to PTSD and depression."  A: Education, support, reassurance, and encouragement provided, q15 minute safety checks initiated. Pt's belongings in locker # 20.    R: Pt denies any concerns at this time, and verbally contracts for safety. Pt ambulating on the unit with no issues. Pt remains safe on the unit.

## 2019-05-01 ENCOUNTER — Other Ambulatory Visit: Payer: Self-pay

## 2019-05-01 NOTE — Progress Notes (Signed)
Patient has been in his room resting but not asleep. He attended group this evening and has voiced no complaints.He is looking forward to discharging on tomorrow. Patient currently denies having pain, -si/hi/a/v hall. Support and encouragement offered, safety maintained on unit, will continue to monitor.

## 2019-05-01 NOTE — Progress Notes (Signed)
Adult Psychoeducational Group Note  Date:  05/01/2019 Time:  9:32 PM  Group Topic/Focus:  Wrap-Up Group:   The focus of this group is to help patients review their daily goal of treatment and discuss progress on daily workbooks.  Participation Level:  Active  Participation Quality:  Appropriate and Attentive  Affect:  Appropriate  Cognitive:  Alert, Appropriate and Oriented  Insight: Appropriate  Engagement in Group:  Engaged  Modes of Intervention:  Discussion and Education  Additional Comments:  Pt attended and participated in group. Pt stated he did not have a goal today but his goal tomorrow will be to prepare for discharge. Pt rated his day 8/10.  Milus Glazier 05/01/2019, 9:32 PM

## 2019-05-01 NOTE — Progress Notes (Signed)
[facility] MD Progress Note  05/01/2019 4:53 PM Charles Crosby  MRN:  [id number] Subjective:  Patient reports " I am feeling fine". Denies medication side effects. Denies suicidal ideations. Denies withdrawal symptoms. Presents future oriented, and hopeful to discharge soon. Objective : I have reviewed chart notes and have met with patient. 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 Presented to hospital for acute depression related to increased alcohol consumption. Reports past history of alcohol abuse, more recently alcohol consumption had decreased, drinking a few times a week. He does acknowledge he drank heavily on day of admission. Admission BAL 244. As per chart there was concern patient had also overdosed on Xanax as an empty bottle was found, but patient denies and UDS was negative for BZDs ( and other substances). Patient currently denies significant depression or neuro-vegetative symptoms, does endorse chronic PTSD symptoms. Reports he has generally been doing well on combination of Geodon, Lamictal, Lexapro.   He denies feeling depressed today, and presents with a full range of affect . No symptoms of alcohol WDL are endorsed, does not present with tremors, diaphoresis, restlessness or psychomotor agitation. Vitals are stable. Denies medication side effects.  Denies suicidal ideations, and presents future oriented . Limited milieu participation. Was reading in his room when seen today. Calm, pleasant on approach.  Principal Problem: PTSD by history, Alcohol Induced Mood Disorder  Diagnosis: Active Problems:   MDD (major depressive disorder), recurrent severe, without psychosis ([place])  Total Time spent with patient: 15 minutes  Past Psychiatric History:   Past Medical History:  Past Medical History:  Diagnosis Date  . Alcohol dependency ([place])    history  . Constipation, chronic   . Hearing loss    Both ears, left ear is worse  . PTSD (post-traumatic stress  disorder)   . 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 LAPAROSCOPIC APPENDECTOMY N/A 10/02/2015   Procedure: APPENDECTOMY LAPAROSCOPIC;  Surgeon: Excell Seltzer, MD;  Location: [facility]S;  Service: General;  Laterality: N/A;  . MYRINGOTOMY    . Vasectomy  2013   Family History:  Family History  Problem Relation Age of Onset  . Hypertension Father   . Hyperlipidemia Father   . Thyroid disease Father   . Thyroid disease Mother   . Alcohol abuse Neg Hx   . OCD Neg Hx   . Paranoid behavior Neg Hx   . Schizophrenia Neg Hx   . Diabetes type II Neg Hx    Family Psychiatric  History:  Social History:  Social History   Substance and Sexual Activity  Alcohol Use Yes  . Alcohol/week: 4.0 standard drinks  . Types: 4 Cans of beer per week     Social History   Substance and Sexual Activity  Drug Use No    Social History   Socioeconomic History  . Marital status: Married    Spouse name: Not on file  . Number of children: Not on file  . Years of education: Not on file  . Highest education level: Not on file  Occupational History  . Not on file  Social Needs  . Financial resource strain: Not on file  . Food insecurity    Worry: Not on file    Inability: Not on file  . Transportation needs    Medical: Not on file    Non-medical: Not on file  Tobacco Use  . Smoking  status: Former Smoker    Packs/day: 1.00    Years: 12.00    Pack years: 12.00  . Smokeless tobacco: Current User    Types: Chew  . Tobacco comment: Currently using e-cigarette-12 mg  Substance and Sexual Activity  . Alcohol use: Yes    Alcohol/week: 4.0 standard drinks    Types: 4 Cans of beer per week  . Drug use: No  . Sexual activity: Yes    Comment: Vasectomy, monogomous relationship with wife  Lifestyle  . Physical activity    Days per week: Not on file    Minutes per session: Not on file  . Stress: Not on file   Relationships  . Social Herbalist on phone: Not on file    Gets together: Not on file    Attends religious service: Not on file    Active member of club or organization: Not on file    Attends meetings of clubs or organizations: Not on file    Relationship status: Not on file  Other Topics Concern  . Not on file  Social History Narrative  . Not on file   Additional Social History:   Sleep: Fair  Appetite:  Good  Current Medications: Current Facility-Administered Medications  Medication Dose Route Frequency Provider Last Rate Last Dose  . acetaminophen (TYLENOL) tablet 650 mg  650 mg Oral Q6H PRN Money, Lowry Ram, FNP      . chlordiazePOXIDE (LIBRIUM) capsule 25 mg  25 mg Oral Q6H PRN Money, Lowry Ram, FNP      . escitalopram (LEXAPRO) tablet 20 mg  20 mg Oral Daily Money, Lowry Ram, FNP   20 mg at 05/01/19 0825  . hydrOXYzine (ATARAX/VISTARIL) tablet 25 mg  25 mg Oral Q6H PRN Money, Lowry Ram, FNP      . lamoTRIgine (LAMICTAL) tablet 100 mg  100 mg Oral QPM Money, Travis B, FNP   100 mg at 04/30/19 1751  . loperamide (IMODIUM) capsule 2-4 mg  2-4 mg Oral PRN Money, Lowry Ram, FNP      . magnesium hydroxide (MILK OF MAGNESIA) suspension 30 mL  30 mL Oral Daily PRN Money, Lowry Ram, FNP      . multivitamin with minerals tablet 1 tablet  1 tablet Oral Daily Money, Lowry Ram, FNP   1 tablet at 05/01/19 0825  . nicotine (NICODERM CQ - dosed in mg/24 hours) patch 21 mg  21 mg Transdermal Daily Money, Lowry Ram, FNP   21 mg at 05/01/19 0826  . thiamine (B-1) injection 100 mg  100 mg Intramuscular Once Money, Darnelle Maffucci B, FNP      . thiamine (VITAMIN B-1) tablet 100 mg  100 mg Oral Daily Money, Lowry Ram, FNP   100 mg at 05/01/19 0825  . ziprasidone (GEODON) capsule 80 mg  80 mg Oral QPM Money, Travis B, FNP   80 mg at 04/30/19 1751    Lab Results: No results found for this or any previous visit (from the past 48 hour(s)).  Blood Alcohol level:  Lab Results  Component Value Date    ETH 244 (H) 35/32/9924    Metabolic Disorder Labs: Lab Results  Component Value Date   HGBA1C 5.5 10/06/2014   No results found for: PROLACTIN Lab Results  Component Value Date   CHOL 163 10/06/2014   TRIG 136 10/06/2014   HDL 38 10/06/2014   CHOLHDL 4.4 08/07/2014   VLDL 15 08/07/2014   LDLCALC 98 10/06/2014   LDLCALC 96  08/07/2014    Physical Findings: AIMS:  , ,  ,  ,    CIWA:  CIWA-Ar Total: 0 COWS:     Musculoskeletal: Strength & Muscle Tone: within normal limits no tremors, no diaphoresis, no restlessness or agitation Gait & Station: normal Patient leans: N/A  Psychiatric Specialty Exam: Physical Exam  ROS denies headache, denies chest pain, denies cough or shortness of breath, no vomiting, no fever or chills   Blood pressure 117/74, pulse 76, temperature 98.2 F (36.8 C), temperature source Oral, resp. rate 20, height _0  (1.803 m), weight 83.9 kg, SpO2 100 %.Body mass index is 25.8 kg/m.  General Appearance: Well Groomed  Eye Contact:  Good  Speech:  Normal Rate  Volume:  Normal  Mood:  denies depression at this time, presents euthymic   Affect:  appropriate , calm  Thought Process:  Linear and Descriptions of Associations: Intact  Orientation:  Other:  fully alert and attentive  Thought Content:  denies hallucinations, no delusions  Suicidal Thoughts:  No currently denies suicidal or self injurious ideations, denies homicidal or violent ideations  Homicidal Thoughts:  No  Memory:  recent and remote grossly intact   Judgement:  Fair/ improving   Insight:  Fair/ improving   Psychomotor Activity:  Normal  Concentration:  Concentration: Good and Attention Span: Good  Recall:  Good  Fund of Knowledge:  Good  Language:  Good  Akathisia:  Negative  Handed:  Right  AIMS (if indicated):     Assets:  Communication Skills Desire for Improvement Resilience  ADL's:  Intact  Cognition:  WNL  Sleep:  Number of Hours: 5   Assessment -  43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 Presented to hospital for acute depression related to increased alcohol consumption. Reports past history of alcohol abuse, more recently alcohol consumption had decreased, drinking a few times a week. He does acknowledge he drank heavily on day of admission. Admission BAL 244. As per chart there was concern patient had also overdosed on Xanax as an empty bottle was found, but patient denies and UDS was negative for BZDs ( and other substances). Patient currently denies significant depression or neuro-vegetative symptoms, does endorse chronic PTSD symptoms. Reports he has generally been doing well on combination of Geodon, Lamictal, Lexapro.   Patient presents calm, pleasant on approach, denies feeling depressed and currently presents euthymic, with reactive/appropriate affect. Denies SI and is future oriented, hoping to discharge soon. Does not endorse or present with symptoms of withdrawal, vitals are stable. Is being continued on home medications ( Lexapro, Lamictal, Geodon) which he states have been effective and well tolerated  Treatment Plan Summary: Daily contact with patient to assess and evaluate symptoms and progress in treatment, Medication management, Plan inpatient treatment and medications as below Encourage group and milieu participation to work on coping skills , symptom reduction Encourage efforts to work on sobriety /abstinence Treatment team working on disposition planning options Continue Lamictal 100 mgrs QDAY for mood disorder Continue Geodon 80 mgrs QHS for mood disorder/PTSD  Continue Lexapro 20 mgrs QDAY for mood disorder/ PTSD Continue Librium PRN for alcohol WDL if needed   Jenne Campus, MD 05/01/2019, 4:53 PM

## 2019-05-01 NOTE — [facility] Group Notes (Signed)
[place] LCSW Group Therapy Note  05/01/2019   10:00-11:00AM  Type of Therapy and Topic:  Group Therapy:  Unhealthy versus Healthy Supports, Which Am I?  Participation Level:  Active   Description of Group:  Patients in this group were introduced to the concept that additional supports including self-support are an essential part of recovery.  Initially a discussion was held about the differences between healthy versus unhealthy supports.  Patients were asked to share what unhealthy supports in their lives need to be addressed, as well as what additional healthy supports could be added for greater help in reaching their goals.   A song entitled "My Own Hero" was played and a group discussion ensued in which patients stated they could relate to the song and it inspired them to realize they have be willing to help themselves in order to succeed, because other people cannot achieve sobriety or stability for them.  We discussed adding a variety of healthy supports to address the various needs in patient lives, including becoming more self-supportive.  A song was played called "I Know Where I've Been" toward the end of group and used to conduct an inspirational wrap-up to group of remembering how far they have already come in their journey.  Therapeutic Goals: 1)  Highlight the differences between healthy and unhealthy supports 2)  Suggest the importance of being a part of one's own support system 2)  Discuss reasons people in one's life may eventually be unable to be continually supportive  3)  Identify the patient's current support system   4) Elicit commitments to add healthy supports and to become more conscious of being self-supportive   Summary of Patient Progress:  The patient listed as healthy supports the following:  Wife, kids, friends.  The patient expressed that unhealthy supports to be addressed include self.  Patient feels they are a unhealthy (at times) support for themselves.  Healthy supports  which could be added for increased stability and happiness include continuing to participate in all treatment activities proactively.  Therapeutic Modalities:   Motivational Interviewing Activity  Maretta Los , MSW, LCSW

## 2019-05-01 NOTE — [facility] Suicide Risk Assessment (Signed)
[place] INPATIENT:  Family/Significant Other Suicide Prevention Education  Suicide Prevention Education:  Education Completed; wife Charles Crosby [phone],  (name of family member/significant other) has been identified by the patient as the family member/significant other with whom the patient will be residing, and identified as the person(s) who will aid the patient in the event of a mental health crisis (suicidal ideations/suicide attempt).  With written consent from the patient, the family member/significant other has been provided the following suicide prevention education, prior to the and/or following the discharge of the patient.  Wife is a Marine scientist and is very familiar with suicide prevention.  She feels that patient needs intensive addiction and PTSD treatment.   The family has to take constant precautions not to trigger him.  She does not feel that patient's current psychiatrist is healthy for patient, because she will give him whatever medicine (i,e. Xanax) he wants.  He has seen the therapist there only once.  Wife is checking into getting him into a support group for veterans at [facility].  "He thinks he can do this on his own.  He acts like it's no big deal.  When we're together as a family doing things, he's fine.  But as soon as I go to work, he falls apart.  He'll buy alcohol at 8am and be drunk by noon."  Family is looking at the possibility of going to see Dr. Loni Dolly in [place], because he was patient's psychiatrist for 6 years prior to moving.     Wife states she is okay with him discharging whenever doctor feels he is sufficiently stable, but she also wants doctor to know that patient is minimizing his drinking problems significantly.   The suicide prevention education provided includes the following:  Suicide risk factors  Suicide prevention and interventions  National Suicide Hotline telephone number  [facility] assessment telephone  number  [facility] Emergency Assistance [place] and/or Residential Mobile Crisis Unit telephone number  Request made of family/significant other to:  Remove weapons (e.g., guns, rifles, knives), all items previously/currently identified as safety concern.    Remove drugs/medications (over-the-counter, prescriptions, illicit drugs), all items previously/currently identified as a safety concern.  The family member/significant other verbalizes understanding of the suicide prevention education information provided.  The family member/significant other agrees to remove the items of safety concern listed above.  Charles Crosby 05/01/2019, 4:42 PM

## 2019-05-01 NOTE — BHH Counselor (Signed)
Adult Comprehensive Assessment  Patient ID: Charles Crosby, male   DOB: [birth year], 43 y.o.   MRN: [id number]  Information Source: Information source: Patient  Current Stressors:  Patient states their primary concerns and needs for treatment are:: To further [facility] with the [facility] Patient states their goals for this hospitilization and ongoing recovery are:: "This was all a mix-up." Educational / Learning stressors: N/A Employment / Job issues: Retired Family Relationships: N/A Museum/gallery curator / Lack of resources (include bankruptcy): N/A Housing / Lack of housing: N/A Physical health (include injuries & life threatening diseases): N/A Social relationships: N/A Substance abuse: N/A Bereavement / Loss: N/A  Living/Environment/Situation:  Living Arrangements: Spouse/significant other, Children Living conditions (as described by patient or guardian): Good Who else lives in the home?: wife, 71yo son How long has patient lived in current situation?: 1-1/2 years in current house, [place] 18 years What is atmosphere in current home: Supportive, Quarry manager, Comfortable  Family History:  Marital status: Married Number of Years Married: 21 What types of issues is patient dealing with in the relationship?: None What is your sexual orientation?: Straight Does patient have children?: Yes How many children?: 3 How is patient's relationship with their children?: 21yo twin boys, 75yo son - good with all  Childhood History:  By whom was/is the patient raised?: Both parents Description of patient's relationship with caregiver when they were a child: Both parents - had a good relationship Patient's description of current relationship with people who raised him/her: Mother - good; Father-good How were you disciplined when you got in trouble as a child/adolescent?: Switched Does patient have siblings?: Yes Number of Siblings: 5 Description of patient's current relationship with siblings: Older sister, 3 older  brothers, younger sister - good with all Did patient suffer any verbal/emotional/physical/sexual abuse as a child?: No Did patient suffer from severe childhood neglect?: No Has patient ever been sexually abused/assaulted/raped as an adolescent or adult?: No Was the patient ever a victim of a crime or a disaster?: No Witnessed domestic violence?: No Has patient been effected by domestic violence as an adult?: No  Education:  Highest grade of school patient has completed: Graduated high school Currently a student?: No Learning disability?: No  Employment/Work Situation:   Employment situation: On disability Why is patient on disability: PTSD How long has patient been on disability: 8 years What is the longest time patient has a held a job?: 8 years Where was the patient employed at that time?: [employer] Did You Receive Any Psychiatric Treatment/Services 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 Type of Psychiatric Treatment/Services in [employer]: Outpatient treatment for PTSD while in the [employer] - served in combat Are There Guns or Other Weapons in [place]?: No  Financial Resources:   Museum/gallery curator resources: Teacher, early years/pre, Income from spouse Does patient have a Programmer, applications or guardian?: No  Alcohol/Substance Abuse:   What has been your use of drugs/alcohol within the last 12 months?: Alcohol - average 2-3 times a week; Marijuana - rarely; Xanax - as prescribed Alcohol/Substance Abuse Treatment Hx: Denies past history Has alcohol/substance abuse ever caused legal problems?: Yes  Social Support System:   Patient's Community Support System: Good Describe Community Support System: Wife, friends, kids Type of faith/religion: Mormon How does patient's faith help to cope with current illness?: Inactive a long time  Leisure/Recreation:   Leisure and Hobbies: Go to the pool, play corn hole, cook out  Strengths/Needs:   What is the patient's perception of their strengths?: Structured,  determined Patient states they can  use these personal strengths during their treatment to contribute to their recovery: Determination to find the help I need.  "I've come a long way since 2012 but I sitll have demons." Patient states these barriers may affect/interfere with their treatment: None Patient states these barriers may affect their return to the community: None Other important information patient would like considered in planning for their treatment: None  Discharge Plan:   Currently receiving community mental health services: Yes (From Whom)(Dr. Chireno? in [place]; therapist is in same place) Patient states concerns and preferences for aftercare planning are: Return to Dr. Laurance Flatten and current therapist.  Wants to add treatment program at the [facility] Patient states they will know when they are safe and ready for discharge when: Now Does patient have access to transportation?: Yes Does patient have financial barriers related to discharge medications?: No Patient description of barriers related to discharge medications: Has income and insurance Will patient be returning to same living situation after discharge?: Yes  Summary/Recommendations:   Summary and Recommendations (to be completed by the evaluator): Patient is a 43yo male admitted, originally under IVC, due to alcohol abuse and a suspicion he had overdosed on Xanax because family found an empty bottle although it had just been filled.  He was intoxicated with a BAL of 244 at the ED, and his UDS was indeed negative for benzodiazepines, supporting his claim that he had not used his prescription in some time.  Primary stressors include PTSD symptoms from his [employer] combat experiences.  He is receiving psychiatric and therapy services at [facility] in [place], and is in the process of getting linked to a veterans' support group through the [facility] to work on his PTSD symptoms.  Patient will benefit from  crisis stabilization, medication evaluation, group therapy and psychoeducation, in addition to case management for discharge planning. At discharge it is recommended that Patient adhere to the established discharge plan and continue in treatment.  Maretta Los. 05/01/2019

## 2019-05-01 NOTE — Progress Notes (Signed)
DAR NOTE: Patient presents with anxious affect and depressed mood.  Denies suicidal thoughts, pain, auditory and visual hallucinations.  Described energy level as normal and concentration.  Rates depression at 0, hopelessness at 0, and anxiety at 0.  Maintained on routine safety checks.  Medications given as prescribed.  Support and encouragement offered as needed.  Attended group and participated.  Patient observed socializing with peers in the dayroom.  Offered no complaint.

## 2019-05-02 DIAGNOSIS — F1099 Alcohol use, unspecified with unspecified alcohol-induced disorder: Secondary | ICD-10-CM

## 2019-05-02 DIAGNOSIS — F4310 Post-traumatic stress disorder, unspecified: Secondary | ICD-10-CM

## 2019-05-02 MED ORDER — ZIPRASIDONE HCL 20 MG PO CAPS: ORAL_CAPSULE | ORAL | 0 refills | Status: DC

## 2019-05-02 MED ORDER — ESCITALOPRAM OXALATE 20 MG PO TABS: 20.0000 mg | ORAL_TABLET | Freq: Every day | ORAL | 0 refills | Status: DC

## 2019-05-02 MED ORDER — ZIPRASIDONE HCL 80 MG PO CAPS: 80.0000 mg | ORAL_CAPSULE | Freq: Every evening | ORAL | 0 refills | Status: DC

## 2019-05-02 MED ORDER — ZOLPIDEM TARTRATE 10 MG PO TABS: 10.0000 mg | ORAL_TABLET | Freq: Every evening | ORAL | 1 refills | Status: DC | PRN

## 2019-05-02 MED ORDER — PRAZOSIN HCL 2 MG PO CAPS: 4.0000 mg | ORAL_CAPSULE | Freq: Every day | ORAL | 5 refills | Status: DC

## 2019-05-02 MED ORDER — ADULT MULTIVITAMIN W/MINERALS CH: 1.0000 | ORAL_TABLET | Freq: Every day | ORAL | Status: DC

## 2019-05-02 MED ORDER — NICOTINE 21 MG/24HR TD PT24: 21.0000 mg | MEDICATED_PATCH | Freq: Every day | TRANSDERMAL | 0 refills | Status: DC

## 2019-05-02 MED ORDER — LAMOTRIGINE 100 MG PO TABS: 100.0000 mg | ORAL_TABLET | Freq: Every evening | ORAL | 0 refills | Status: DC

## 2019-05-02 NOTE — BHH Suicide Risk Assessment (Signed)
[facility] Discharge Suicide Risk Assessment   Principal Problem: PTSD by history , alcohol abuse  Discharge Diagnoses: Active Problems:   MDD (major depressive disorder), recurrent severe, without psychosis ([place])   Total Time spent with patient: 30 minutes  Musculoskeletal: Strength & Muscle Tone: within normal limits Gait & Station: normal Patient leans: N/A  Psychiatric Specialty Exam: ROS denies headache, no chest pain, no shortness of breath, no cough, no vomiting, no fever or chills   Blood pressure 127/81, pulse 90, temperature 98.3 F (36.8 C), temperature source Oral, resp. rate 18, height 5\' 11"  (1.803 m), weight 83.9 kg, SpO2 99 %.Body mass index is 25.8 kg/m.  General Appearance: Well Groomed  Eye Contact::  Good  Speech:  Normal Rate409  Volume:  Normal  Mood:  reports he is doing " all right", presents euthymic  Affect:  Appropriate and Full Range  Thought Process:  Linear and Descriptions of Associations: Intact  Orientation:  Full (Time, Place, and Person)  Thought Content:  denies hallucinations, no delusions, not internally preoccupied   Suicidal Thoughts:  No denies suicidal or self injurious ideations, denies homicidal or violent ideations  Homicidal Thoughts:  No  Memory:  recent and remote grossly intact   Judgement:  Other:   improving  Insight:  fair - improving   Psychomotor Activity:  Normal- no psychomotor agitation or restlessness, no tremors or diaphoresis  Concentration:  Good  Recall:  Good  Fund of Knowledge:Good  Language: Good  Akathisia:  Negative  Handed:  Right  AIMS (if indicated):     Assets:  Communication Skills Desire for Improvement Resilience  Sleep:  Number of Hours: 6.75  Cognition: WNL  ADL's:  Intact   Mental Status Per Nursing Assessment::   On Admission:  Self-harm behaviors  Demographic Factors:  married, three children ( 72 year old twins), 43 year old, lives with wife and teenaged son, currently unemployed. [employer].   Loss Factors: Reports episode of increased alcohol consumption. History of PTSD.  Historical Factors: PTSD, alcohol abuse history  Risk Reduction Factors:   Responsible for children under 24 years of age, Sense of responsibility to family, Living with another person, especially a relative, Positive social support and Positive coping skills or problem solving skills  Continued Clinical Symptoms:  At this time patient is fully alert, attentive, calm, pleasant on approach, no symptoms of WDL, denies feeling depressed and presents euthymic, affect appropriate and full in range, no thought disorder, no suicidal or self injurious ideations, no homicidal or violent ideations, no psychotic symptoms, future oriented. Behavior on unit in good control. Pleasant , calm on approach. Denies medication side effects. No current symptoms of alcohol WDL With his express consent I spoke with his wife via phone, who corroborates he presents improved and is in agreement with discharge. She described patient tends to minimize alcohol consumption and how it affects him.  Cognitive Features That Contribute To Risk:  No gross cognitive deficits noted upon discharge. Is alert , attentive, and oriented x 3    Suicide Risk:  Mild:  Suicidal ideation of limited frequency, intensity, duration, and specificity.  There are no identifiable plans, no associated intent, mild dysphoria and related symptoms, good self-control (both objective and subjective assessment), few other risk factors, and identifiable protective factors, including available and accessible social support.  Follow-up Information    [facility]. Go on 05/19/2019.   Specialty: Psychiatry Why: Appointment with Dr. Marjory Sneddon for medication management is scheduled for  Thursday, 7/2 at 2:00p. Please bring you current medications and discharge paperwork.  Therapy appointment with Cecilie Lowers is Monday, 7/13 at 1:00p.  Contact  information: [address] [place] [address] [phone]           Plan Of Care/Follow-up recommendations:  Activity:  as tolerated Diet:  regular Tests:  NA Other:  See below  Patient is expressing readiness for discharge, leaving unit in good spirits, no current grounds for involuntary commitment Planning on returning home and planning to follow up with the [place]. Also states he may decide to follow up with Dr. Loni Dolly , a psychiatrist in [place] whom he saw for years and with whom he has a good therapeutic alliance . We reviewed concerns related to binge drinking and reviewed risks associated with BZD/ Alcohol in combination. Encouraged him to consider abstinence from alcohol as a treatment goal.  Jenne Campus, MD 05/02/2019, 11:10 AM

## 2019-05-02 NOTE — Tx Team (Signed)
Interdisciplinary Treatment and Diagnostic Plan Update  05/02/2019 Time of Session: 09:40am Charles Crosby MRN: [id number]  Principal Diagnosis: <principal problem not specified>  Secondary Diagnoses: Active Problems:   MDD (major depressive disorder), recurrent severe, without psychosis ([place])   PTSD (post-traumatic stress disorder)   Alcohol-related disorder ([place])   Current Medications:  Current Facility-Administered Medications  Medication Dose Route Frequency Provider Last Rate Last Dose  . acetaminophen (TYLENOL) tablet 650 mg  650 mg Oral Q6H PRN Money, Lowry Ram, FNP      . chlordiazePOXIDE (LIBRIUM) capsule 25 mg  25 mg Oral Q6H PRN Money, Lowry Ram, FNP      . escitalopram (LEXAPRO) tablet 20 mg  20 mg Oral Daily Money, Lowry Ram, FNP   20 mg at 05/02/19 4742  . hydrOXYzine (ATARAX/VISTARIL) tablet 25 mg  25 mg Oral Q6H PRN Money, Lowry Ram, FNP   25 mg at 05/01/19 2102  . lamoTRIgine (LAMICTAL) tablet 100 mg  100 mg Oral QPM Money, Darnelle Maffucci B, FNP   100 mg at 05/01/19 1751  . loperamide (IMODIUM) capsule 2-4 mg  2-4 mg Oral PRN Money, Lowry Ram, FNP      . magnesium hydroxide (MILK OF MAGNESIA) suspension 30 mL  30 mL Oral Daily PRN Money, Lowry Ram, FNP      . multivitamin with minerals tablet 1 tablet  1 tablet Oral Daily Money, Lowry Ram, FNP   1 tablet at 05/02/19 0751  . nicotine (NICODERM CQ - dosed in mg/24 hours) patch 21 mg  21 mg Transdermal Daily Money, Lowry Ram, FNP   21 mg at 05/02/19 0751  . thiamine (B-1) injection 100 mg  100 mg Intramuscular Once Money, Darnelle Maffucci B, FNP      . thiamine (VITAMIN B-1) tablet 100 mg  100 mg Oral Daily Money, Lowry Ram, FNP   100 mg at 05/02/19 0751  . ziprasidone (GEODON) capsule 80 mg  80 mg Oral QPM Money, Lowry Ram, FNP   80 mg at 05/01/19 1751   PTA Medications: Medications Prior to Admission  Medication Sig Dispense Refill Last Dose  . ALPRAZolam (XANAX) 0.5 MG tablet Take 0.25-0.5 mg by mouth daily as needed for anxiety.      .  AMBULATORY NON FORMULARY MEDICATION Medication Name: CPAP with mask, tubing, and humidifier.  Dx OSA with AHI of 21.  Set to Autopap and then download after one week so we can set his pressure. Range of auto pressure: 4-20 CmH2O. 1 Units PRN   . diclofenac sodium (VOLTAREN) 1 % GEL Apply 2 g topically 4 (four) times daily. To affected joint. (Patient not taking: Reported on 04/28/2019) 100 g 11   . esomeprazole (NEXIUM) 40 MG capsule TAKE 1 CAPSULE(40 MG) BY MOUTH DAILY BEFORE BREAKFAST (Patient not taking: Reported on 04/28/2019) 90 capsule 0   . ziprasidone (GEODON) 80 MG capsule Take 80 mg by mouth every evening.     . [DISCONTINUED] escitalopram (LEXAPRO) 20 MG tablet Take 20 mg by mouth daily.     . [DISCONTINUED] lamoTRIgine (LAMICTAL) 100 MG tablet Take 100 mg by mouth every evening.       Patient Stressors: Substance abuse Other: "shame of having PTSD"  Patient Strengths: Average or above average intelligence Communication skills General fund of knowledge Supportive family/friends  Treatment Modalities: Medication Management, Group therapy, Case management,  1 to 1 session with clinician, Psychoeducation, Recreational therapy.   Physician Treatment Plan for Primary Diagnosis: <principal problem not specified> Long Term Goal(s): Improvement in  symptoms so as ready for discharge Improvement in symptoms so as ready for discharge   Short Term Goals: Ability to identify changes in lifestyle to reduce recurrence of condition will improve Ability to verbalize feelings will improve Ability to disclose and discuss suicidal ideas Ability to demonstrate self-control will improve Ability to identify and develop effective coping behaviors will improve Ability to maintain clinical measurements within normal limits will improve Ability to identify changes in lifestyle to reduce recurrence of condition will improve Ability to verbalize feelings will improve Ability to disclose and discuss  suicidal ideas Ability to demonstrate self-control will improve Ability to identify and develop effective coping behaviors will improve Ability to maintain clinical measurements within normal limits will improve  Medication Management: Evaluate patient's response, side effects, and tolerance of medication regimen.  Therapeutic Interventions: 1 to 1 sessions, Unit Group sessions and Medication administration.  Evaluation of Outcomes: Adequate for Discharge  Physician Treatment Plan for Secondary Diagnosis: Active Problems:   MDD (major depressive disorder), recurrent severe, without psychosis ([name])   PTSD (post-traumatic stress disorder)   Alcohol-related disorder ([name])  Long Term Goal(s): Improvement in symptoms so as ready for discharge Improvement in symptoms so as ready for discharge   Short Term Goals: Ability to identify changes in lifestyle to reduce recurrence of condition will improve Ability to verbalize feelings will improve Ability to disclose and discuss suicidal ideas Ability to demonstrate self-control will improve Ability to identify and develop effective coping behaviors will improve Ability to maintain clinical measurements within normal limits will improve Ability to identify changes in lifestyle to reduce recurrence of condition will improve Ability to verbalize feelings will improve Ability to disclose and discuss suicidal ideas Ability to demonstrate self-control will improve Ability to identify and develop effective coping behaviors will improve Ability to maintain clinical measurements within normal limits will improve     Medication Management: Evaluate patient's response, side effects, and tolerance of medication regimen.  Therapeutic Interventions: 1 to 1 sessions, Unit Group sessions and Medication administration.  Evaluation of Outcomes: Adequate for Discharge   RN Treatment Plan for Primary Diagnosis: <principal problem not specified> Long Term  Goal(s): Knowledge of disease and therapeutic regimen to maintain health will improve  Short Term Goals: Ability to participate in decision making will improve, Ability to verbalize feelings will improve, Ability to disclose and discuss suicidal ideas, Ability to identify and develop effective coping behaviors will improve and Compliance with prescribed medications will improve  Medication Management: RN will administer medications as ordered by provider, will assess and evaluate patient's response and provide education to patient for prescribed medication. RN will report any adverse and/or side effects to prescribing provider.  Therapeutic Interventions: 1 on 1 counseling sessions, Psychoeducation, Medication administration, Evaluate responses to treatment, Monitor vital signs and CBGs as ordered, Perform/monitor CIWA, COWS, AIMS and Fall Risk screenings as ordered, Perform wound care treatments as ordered.  Evaluation of Outcomes: Adequate for Discharge   LCSW Treatment Plan for Primary Diagnosis: <principal problem not specified> Long Term Goal(s): Safe transition to appropriate next level of care at discharge, Engage patient in therapeutic group addressing interpersonal concerns.  Short Term Goals: Engage patient in aftercare planning with referrals and resources and Increase skills for wellness and recovery  Therapeutic Interventions: Assess for all discharge needs, 1 to 1 time with Social worker, Explore available resources and support systems, Assess for adequacy in community support network, Educate family and significant other(s) on suicide prevention, Complete Psychosocial Assessment, Interpersonal group therapy.  Evaluation  of Outcomes: Adequate for Discharge   Progress in Treatment: Attending groups: Yes. Participating in groups: Yes. Taking medication as prescribed: Yes. Toleration medication: Yes. Family/Significant other contact made: Yes, individual(s) contacted:  pt's  wife Patient understands diagnosis: Yes. Discussing patient identified problems/goals with staff: Yes. Medical problems stabilized or resolved: Yes. Denies suicidal/homicidal ideation: Yes. Issues/concerns per patient self-inventory: No. Other:   New problem(s) identified: No, Describe:  None  New Short Term/Long Term Goal(s): Medication stabilization, elimination of SI thoughts, and development of a comprehensive mental wellness plan.   Patient Goals:  "To leave"  Discharge Plan or Barriers:  Pt is discharging today  Reason for Continuation of Hospitalization: Pt is discharging today  Estimated Length of Stay: Pt is discharging today  Attendees: Patient: 05/02/2019   Physician: Dr. Neita Garnet, MD 05/02/2019   Nursing: Benjamine Mola, RN 05/02/2019   RN Care Manager: 05/02/2019   Social Worker: Ardelle Anton, LCSW 05/02/2019   Recreational Therapist:  05/02/2019   Other:  05/02/2019   Other:  05/02/2019   Other: 05/02/2019      Scribe for Treatment Team: Trecia Rogers, LCSW 05/02/2019 11:27 AM

## 2019-05-02 NOTE — Plan of Care (Signed)
  Problem: Education: Goal: Knowledge of [facility] Education information/materials will improve Outcome: Adequate for Discharge Goal: Emotional status will improve Outcome: Adequate for Discharge Goal: Mental status will improve Outcome: Adequate for Discharge Goal: Verbalization of understanding the information provided will improve Outcome: Adequate for Discharge   Problem: Activity: Goal: Interest or engagement in activities will improve Outcome: Adequate for Discharge Goal: Sleeping patterns will improve Outcome: Adequate for Discharge   Problem: Coping: Goal: Ability to verbalize frustrations and anger appropriately will improve Outcome: Adequate for Discharge Goal: Ability to demonstrate self-control will improve Outcome: Adequate for Discharge   Problem: Health Behavior/Discharge Planning: Goal: Identification of resources available to assist in meeting health care needs will improve Outcome: Adequate for Discharge Goal: Compliance with treatment plan for underlying cause of condition will improve Outcome: Adequate for Discharge   Problem: Physical Regulation: Goal: Ability to maintain clinical measurements within normal limits will improve Outcome: Adequate for Discharge   Problem: Safety: Goal: Periods of time without injury will increase Outcome: Adequate for Discharge   Problem: Education: Goal: Knowledge of disease or condition will improve Outcome: Adequate for Discharge Goal: Understanding of discharge needs will improve Outcome: Adequate for Discharge   Problem: Health Behavior/Discharge Planning: Goal: Ability to identify changes in lifestyle to reduce recurrence of condition will improve Outcome: Adequate for Discharge Goal: Identification of resources available to assist in meeting health care needs will improve Outcome: Adequate for Discharge   Problem: Physical Regulation: Goal: Complications related to the disease process, condition or  treatment will be avoided or minimized Outcome: Adequate for Discharge   Problem: Safety: Goal: Ability to remain free from injury will improve Outcome: Adequate for Discharge   Problem: Education: Goal: Ability to state activities that reduce stress will improve Outcome: Adequate for Discharge   Problem: Coping: Goal: Ability to identify and develop effective coping behavior will improve Outcome: Adequate for Discharge   Problem: Self-Concept: Goal: Ability to identify factors that promote anxiety will improve Outcome: Adequate for Discharge Goal: Level of anxiety will decrease Outcome: Adequate for Discharge Goal: Ability to modify response to factors that promote anxiety will improve Outcome: Adequate for Discharge

## 2019-05-02 NOTE — Discharge Summary (Addendum)
Physician Discharge Summary Note  Patient:  Charles Crosby is an 43 y.o., male MRN:  [id number] DOB:  January 01, 1976 Patient phone:  [phone] (home)  Patient address:   [address] [place] [address],  Total Time spent with patient: 15 minutes  Date of Admission:  04/30/2019 Date of Discharge: 05/02/19  Reason for Admission:  Alcohol abuse  Principal Problem: <principal problem not specified> Discharge Diagnoses: Active Problems:   MDD (major depressive disorder), recurrent severe, without psychosis ([place])   PTSD (post-traumatic stress disorder)   Alcohol-related disorder ([place])   Past Psychiatric History: Per admission H&P: Denies history of severe depressive episodes, denies history of suicide attempts, denies history of mania, denies history of psychosis, reports history of PTSD related to combat experiences while serving in the [employer]. Describes some agoraphobia. History of prior psychiatric admissions in 2013, which he states was  for PTSD .  Past Medical History:  Past Medical History:  Diagnosis Date  . Alcohol dependency ([place])    history  . Constipation, chronic   . Hearing loss    Both ears, left ear is worse  . PTSD (post-traumatic stress disorder)   . 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 LAPAROSCOPIC APPENDECTOMY N/A 10/02/2015   Procedure: APPENDECTOMY LAPAROSCOPIC;  Surgeon: Excell Seltzer, MD;  Location: [facility];  Service: General;  Laterality: N/A;  . MYRINGOTOMY    . Vasectomy  2013   Family History:  Family History  Problem Relation Age of Onset  . Hypertension Father   . Hyperlipidemia Father   . Thyroid disease Father   . Thyroid disease Mother   . Alcohol abuse Neg Hx   . OCD Neg Hx   . Paranoid behavior Neg Hx   . Schizophrenia Neg Hx   . Diabetes type II Neg Hx    Family Psychiatric  History: Denies history of mental illness in family, no history of suicides  in family,  denies history of substance abuse in family Social History:  Social History   Substance and Sexual Activity  Alcohol Use Yes  . Alcohol/week: 4.0 standard drinks  . Types: 4 Cans of beer per week     Social History   Substance and Sexual Activity  Drug Use No    Social History   Socioeconomic History  . Marital status: Married    Spouse name: Not on file  . Number of children: Not on file  . Years of education: Not on file  . Highest education level: Not on file  Occupational History  . Not on file  Social Needs  . Financial resource strain: Not on file  . Food insecurity    Worry: Not on file    Inability: Not on file  . Transportation needs    Medical: Not on file    Non-medical: Not on file  Tobacco Use  . Smoking status: Former Smoker    Packs/day: 1.00    Years: 12.00    Pack years: 12.00  . Smokeless tobacco: Current User    Types: Chew  . Tobacco comment: Currently using e-cigarette-12 mg  Substance and Sexual Activity  . Alcohol use: Yes    Alcohol/week: 4.0 standard drinks    Types: 4 Cans of beer per week  . Drug use: No  . Sexual activity: Yes    Comment: Vasectomy, monogomous relationship with wife  Lifestyle  . Physical activity    Days  per week: Not on file    Minutes per session: Not on file  . Stress: Not on file  Relationships  . Social Herbalist on phone: Not on file    Gets together: Not on file    Attends religious service: Not on file    Active member of club or organization: Not on file    Attends meetings of clubs or organizations: Not on file    Relationship status: Not on file  Other Topics Concern  . Not on file  Social History Narrative  . Not on file    Hospital Course:  From admission H&P: Patient presented to [facility] hospital voluntarily on 6/11. States he presented to hospital due at the recommendation of a personal friend, who is a Teacher, music. He reports history of intermittent alcohol use, but  states that on day of admission" I did drink a lot more, I guess I did not stop myself". There was also concern that patient had overdosed on Xanax, as wife found empty  bottle , which he states led to him being involuntarily committed Patient denies having taken or overdosed on Xanax and states " I had not taken Xanax for weeks, I only take it when my anxiety gets really bad ". (  UDS is negative) . On admission to [facility] he was intoxicated and was referred to ED.  Admission UDS negative ( negative for BZDs), BAL 244. Currently denies significant depression, and denies suicidal ideations. Denies neuro-vegetative symptoms. Denies depression or anhedonia.  Chart notes do indicate that wife reported patient has been withdrawn , isolative, and that he told her he wanted to give up". Patient states " I do need help for PTSD, but I was definitely not suicidal or trying to kill myself ". States that his wife is currently trying to get him into a PTSD focused program at the [place].  Mr. Martis was admitted for alcohol abuse. His wife reported patient had made suicidal statements recently. CIWA protocol was started with Librium PRN CIWA>10. Patient stated preference to continue home medication regimen- Lexapro, Geodon and Lamictal were continued. He participated in group therapy on the unit. He remained on the [facility] unit for 2 days. He stabilized with medication and therapy. He was discharged on the medications listed below. He has shown improvement with improved mood, affect, sleep, appetite, and interaction. He denies any SI/HI/AVH and contracts for safety. He agrees to follow up at [facility] (see below) as well as the [place]. He is provided with prescriptions for medications upon discharge. His wife is picking him up for discharge home.  Physical Findings: AIMS:  , ,  ,  ,    CIWA:  CIWA-Ar Total: 0 COWS:     Musculoskeletal: Strength & Muscle Tone: within normal limits Gait & Station: normal Patient leans:  N/A  Psychiatric Specialty Exam: Physical Exam  Nursing note and vitals reviewed. Constitutional: He is oriented to person, place, and time. He appears well-developed and well-nourished.  Cardiovascular: Normal rate.  Respiratory: Effort normal.  Neurological: He is alert and oriented to person, place, and time.    Review of Systems  Constitutional: Negative.   Respiratory: Negative for cough and shortness of breath.   Cardiovascular: Negative for chest pain.  Gastrointestinal: Negative for diarrhea, nausea and vomiting.  Neurological: Negative for headaches.  Psychiatric/Behavioral: Positive for depression (stable on medication) and substance abuse. Negative for hallucinations and suicidal ideas. The patient is not nervous/anxious and does not have insomnia.  Blood pressure 127/81, pulse 90, temperature 98.3 F (36.8 C), temperature source Oral, resp. rate 18, height 5\' 11"  (1.803 m), weight 83.9 kg, SpO2 99 %.Body mass index is 25.8 kg/m.  See MD's discharge SRA        Has this patient used any form of tobacco in the last 30 days? (Cigarettes, Smokeless Tobacco, Cigars, and/or Pipes) Yes, a prescription for an FDA-approved medication for tobacco cessation was offered at discharge.   Blood Alcohol level:  Lab Results  Component Value Date   ETH 244 (H) 95/63/8756    Metabolic Disorder Labs:  Lab Results  Component Value Date   HGBA1C 5.5 10/06/2014   No results found for: PROLACTIN Lab Results  Component Value Date   CHOL 163 10/06/2014   TRIG 136 10/06/2014   HDL 38 10/06/2014   CHOLHDL 4.4 08/07/2014   VLDL 15 08/07/2014   LDLCALC 98 10/06/2014   LDLCALC 96 08/07/2014    See Psychiatric Specialty Exam and Suicide Risk Assessment completed by Attending Physician prior to discharge.  Discharge destination:  Home  Is patient on multiple antipsychotic therapies at discharge:  No   Has Patient had three or more failed trials of antipsychotic monotherapy by  history:  No  Recommended Plan for Multiple Antipsychotic Therapies: NA  Discharge Instructions    Discharge instructions   Complete by: As directed    Patient is instructed to take all prescribed medications as recommended. Report any side effects or adverse reactions to your outpatient psychiatrist. Patient is instructed to abstain from alcohol and illegal drugs while on prescription medications. In the event of worsening symptoms, patient is instructed to call the crisis hotline, 911, or go to the nearest emergency department for evaluation and treatment.     Allergies as of 05/02/2019      Reactions   Oxycodone Rash      Medication List    STOP taking these medications   ALPRAZolam 0.5 MG tablet Commonly known as: XANAX   diclofenac sodium 1 % Gel Commonly known as: VOLTAREN   escitalopram 20 MG tablet Commonly known as: LEXAPRO   esomeprazole 40 MG capsule Commonly known as: [place] these medications     Indication  AMBULATORY NON FORMULARY MEDICATION Medication Name: CPAP with mask, tubing, and humidifier.  Dx OSA with AHI of 21.  Set to Autopap and then download after one week so we can set his pressure. Range of auto pressure: 4-20 CmH2O.  Indication: OSA   lamoTRIgine 100 MG tablet Commonly known as: LAMICTAL Take 1 tablet (100 mg total) by mouth every evening.  Indication: Mood   multivitamin with minerals Tabs tablet Take 1 tablet by mouth daily. Start taking on: May 03, 2019  Indication: Supplementation   nicotine 21 mg/24hr patch Commonly known as: NICODERM CQ - dosed in mg/24 hours Place 1 patch (21 mg total) onto the skin daily. Start taking on: May 03, 2019  Indication: Nicotine Addiction   prazosin 2 MG capsule Commonly known as: MINIPRESS Take 2 capsules (4 mg total) by mouth at bedtime.  Indication: Frightening Dreams   ziprasidone 20 MG capsule Commonly known as: Geodon 3 at hs x 3 days 2 at hs x 3 days 1 at hs till  gone What changed:   medication strength  how much to take  how to take this  when to take this  additional instructions  Indication: Manic-Depression   zolpidem 10 MG tablet Commonly known as: Ambien Take  1 tablet (10 mg total) by mouth at bedtime as needed for up to 30 days for sleep.  Indication: Trouble Sleeping      Follow-up Information    Med[facility], [place]. Go on 05/19/2019.   Specialty: Psychiatry Why: Appointment with Dr. Marjory Sneddon for medication management is scheduled for Thursday, 7/2 at 2:00p. Please bring you current medications and discharge paperwork.  Therapy appointment with Cecilie Lowers is Monday, 7/13 at 1:00p.  Contact information: [address]  [place] [address] [phone]           Follow-up recommendations: Activity as tolerated. Diet as recommended by primary care physician. Keep all scheduled follow-up appointments as recommended.   Comments:   Patient is instructed to take all prescribed medications as recommended. Report any side effects or adverse reactions to your outpatient psychiatrist. Patient is instructed to abstain from alcohol and illegal drugs while on prescription medications. In the event of worsening symptoms, patient is instructed to call the crisis hotline, 911, or go to the nearest emergency department for evaluation and treatment.  Signed: Connye Burkitt, NP 05/02/2019, 1:23 PM   Patient seen, Suicide Assessment Completed.  Disposition Plan Reviewed

## 2019-05-02 NOTE — Progress Notes (Signed)
  [facility] Adult Case Management Discharge Plan :  Will you be returning to the same living situation after discharge:  Yes,  home At discharge, do you have transportation home?: Yes,  wife picking up at 11:30am Do you have the ability to pay for your medications: Yes,  BCBS insurance  Release of information consent forms completed and in the chart.  Patient to Follow up at: Follow-up Information    Med[facility], [place]. Go on 05/19/2019.   Specialty: Psychiatry Why: Appointment with Dr. Marjory Sneddon for medication management is scheduled for Thursday, 7/2 at 2:00p. Please bring you current medications and discharge paperwork.  Therapy appointment with Cecilie Lowers is Monday, 7/13 at 1:00p.  Contact information: [address] [place] [address] [phone]           Next level of care provider has access to [place] and Suicide Prevention discussed: Yes,  with wife     Has patient been referred to the Quitline?: N/A patient is not a smoker  Patient has been referred for addiction treatment: Yes  Joellen Jersey, [place] 05/02/2019, 9:58 AM

## 2019-05-02 NOTE — Progress Notes (Signed)
Pt discharged to lobby. Pt was stable and appreciative at that time. All papers and prescriptions were given and valuables returned. Verbal understanding expressed. Denies SI/HI and A/VH. Pt given opportunity to express concerns and ask questions.  

## 2019-05-06 ENCOUNTER — Encounter: Payer: Self-pay | Admitting: Family Medicine

## 2020-01-05 DIAGNOSIS — F1011 Alcohol abuse, in remission: Secondary | ICD-10-CM | POA: Insufficient documentation

## 2020-06-28 ENCOUNTER — Encounter: Payer: Federal, State, Local not specified - PPO | Admitting: Family Medicine

## 2020-07-15 ENCOUNTER — Encounter: Payer: Self-pay | Admitting: Family Medicine

## 2020-07-17 ENCOUNTER — Other Ambulatory Visit: Payer: Self-pay

## 2020-07-17 ENCOUNTER — Ambulatory Visit (INDEPENDENT_AMBULATORY_CARE_PROVIDER_SITE_OTHER): Payer: Federal, State, Local not specified - PPO | Admitting: Family Medicine

## 2020-07-17 ENCOUNTER — Encounter: Payer: Self-pay | Admitting: Family Medicine

## 2020-07-17 VITALS — BP 116/64 | HR 88 | Ht 71.0 in | Wt 191.0 lb

## 2020-07-17 DIAGNOSIS — S069X9S Unspecified intracranial injury with loss of consciousness of unspecified duration, sequela: Secondary | ICD-10-CM

## 2020-07-17 DIAGNOSIS — Z0000 Encounter for general adult medical examination without abnormal findings: Secondary | ICD-10-CM | POA: Diagnosis not present

## 2020-07-17 DIAGNOSIS — R790 Abnormal level of blood mineral: Secondary | ICD-10-CM

## 2020-07-17 DIAGNOSIS — R55 Syncope and collapse: Secondary | ICD-10-CM | POA: Diagnosis not present

## 2020-07-17 NOTE — Patient Instructions (Signed)

## 2020-07-17 NOTE — Assessment & Plan Note (Signed)
Recurrent every 1-2 mo. unclear etiology at this point.  Could be some type of arrhythmia, electrolyte disturbance etc.  Blood pressure looks phenomenal today.  EKG shows rate of 72 bpm, normal sinus rhythm no acute ST-T wave changes.  We will schedule for echocardiogram and cardiac event monitor.

## 2020-07-17 NOTE — Progress Notes (Signed)
Established Patient Office Visit  Subjective:  Patient ID: Charles Crosby, male    DOB: 1976-05-06  Age: 44 y.o. MRN: [id number]  CC:  Chief Complaint  Patient presents with  . Annual Exam     HPI Charles Crosby presents for physical.  He reports that he thinks he had his tetanus about 5 years ago when he cut his finger and it required sutures.  He works out about 5 days/week at [employer] doing cardio and weights.   Patient reports that he has been having occasional syncopal episodes maybe once every couple of months.  He says he can tell it is coming on he feels like he is going to pass out and that he usually goes down on 1 knee.  He is not sure how long he is out when it does happen.  He says he is not able to pinpoint any specific causes such as lack of food or dehydration.  He does not notice any chest pain or palpitations before it happens and he denies any dizziness or room spinning.  He denies any shortness of breath.  He also reports he has been really struggling more with his memory has a history of traumatic brain injury after an IED injury in [place] back in 2012 he feels like his memory is just gradually getting worse he said now is getting to the point where he is having a hard time remembering more minor streaks in the town that he lives he no longer does things like calculations and balancing his checkbook and doing his account.  He says even keeping track of score while playing a game is difficult for him.  He feels like his short-term memory is mostly impacted.  Past Medical History:  Diagnosis Date  . Alcohol dependency ([place])    history  . Constipation, chronic   . Hearing loss    Both ears, left ear is worse  . PTSD (post-traumatic stress disorder)   . 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 LAPAROSCOPIC APPENDECTOMY N/A 10/02/2015   Procedure: APPENDECTOMY LAPAROSCOPIC;   Surgeon: Excell Seltzer, MD;  [facility]: [facility];  Service: General;  Laterality: N/A;  . MYRINGOTOMY    . Vasectomy  2013    Family History  Problem Relation Age of Onset  . Hypertension Father   . Hyperlipidemia Father   . Thyroid disease Father   . Thyroid disease Mother   . Alcohol abuse Neg Hx   . OCD Neg Hx   . Paranoid behavior Neg Hx   . Schizophrenia Neg Hx   . Diabetes type II Neg Hx     Social History   Socioeconomic History  . Marital status: Married    Spouse name: Not on file  . Number of children: Not on file  . Years of education: Not on file  . Highest education level: Not on file  Occupational History  . Not on file  Tobacco Use  . Smoking status: Former Smoker    Packs/day: 1.00    Years: 12.00    Pack years: 12.00    Quit date: 12/19/2019    Years since quitting: 0.5  . Smokeless tobacco: Former Systems developer    Types: Chew  Substance and Sexual Activity  . Alcohol use: Yes    Alcohol/week: 4.0 standard drinks    Types: 4 Cans of beer per week  . Drug use: No  .  Sexual activity: Yes    Comment: Vasectomy, monogomous relationship with wife  Other Topics Concern  . Not on file  Social History Narrative  . Not on file   Social Determinants of Health   Financial Resource Strain:   . Difficulty of Paying Living Expenses: Not on file  Food Insecurity:   . Worried About Charity fundraiser in the Last Year: Not on file  . Ran Out of Food in the Last Year: Not on file  Transportation Needs:   . Lack of Transportation (Medical): Not on file  . Lack of Transportation (Non-Medical): Not on file  Physical Activity:   . Days of Exercise per Week: Not on file  . Minutes of Exercise per Session: Not on file  Stress:   . Feeling of Stress : Not on file  Social Connections:   . Frequency of Communication with Friends and Family: Not on file  . Frequency of Social Gatherings with Friends and Family: Not on file  . Attends Religious Services: Not on file  .  Active Member of Clubs or Organizations: Not on file  . Attends Archivist Meetings: Not on file  . Marital Status: Not on file  Intimate Partner Violence:   . Fear of Current or Ex-Partner: Not on file  . Emotionally Abused: Not on file  . Physically Abused: Not on file  . Sexually Abused: Not on file    Outpatient Medications Prior to Visit  Medication Sig Dispense Refill  . AMBULATORY NON FORMULARY MEDICATION Medication Name: CPAP with mask, tubing, and humidifier.  Dx OSA with AHI of 21.  Set to Autopap and then download after one week so we can set his pressure. Range of auto pressure: 4-20 CmH2O. 1 Units PRN  . lamoTRIgine (LAMICTAL) 100 MG tablet Take 1 tablet (100 mg total) by mouth every evening. 30 tablet 0  . Multiple Vitamin (MULTIVITAMIN WITH MINERALS) TABS tablet Take 1 tablet by mouth daily.    . sildenafil (REVATIO) 20 MG tablet SMARTSIG:0-5 Tablet(s) By Mouth    . ziprasidone (GEODON) 80 MG capsule Take 80 mg by mouth daily.    . nicotine (NICODERM CQ - DOSED IN MG/24 HOURS) 21 mg/24hr patch Place 1 patch (21 mg total) onto the skin daily. 28 patch 0  . prazosin (MINIPRESS) 2 MG capsule Take 2 capsules (4 mg total) by mouth at bedtime. 60 capsule 5  . ziprasidone (GEODON) 20 MG capsule 3 at hs x 3 days 2 at hs x 3 days 1 at hs till gone 18 capsule 0  . zolpidem (AMBIEN) 10 MG tablet Take 1 tablet (10 mg total) by mouth at bedtime as needed for up to 30 days for sleep. 30 tablet 1   No facility-administered medications prior to visit.    Allergies  Allergen Reactions  . Oxycodone Rash    ROS Review of Systems    Objective:    Physical Exam Constitutional:      Appearance: He is well-developed.  HENT:     Head: Normocephalic and atraumatic.     Right Ear: External ear normal.     Left Ear: External ear normal.     Nose: Nose normal.  Eyes:     Conjunctiva/sclera: Conjunctivae normal.     Pupils: Pupils are equal, round, and reactive to light.   Neck:     Thyroid: No thyromegaly.  Cardiovascular:     Rate and Rhythm: Normal rate and regular rhythm.  Heart sounds: Normal heart sounds.  Pulmonary:     Effort: Pulmonary effort is normal.     Breath sounds: Normal breath sounds.  Abdominal:     General: Bowel sounds are normal. There is no distension.     Palpations: Abdomen is soft. There is no mass.     Tenderness: There is no abdominal tenderness. There is no guarding or rebound.  Musculoskeletal:        General: Normal range of motion.     Cervical back: Normal range of motion and neck supple.  Lymphadenopathy:     Cervical: No cervical adenopathy.  Skin:    General: Skin is warm and dry.  Neurological:     Mental Status: He is alert and oriented to person, place, and time.     Deep Tendon Reflexes: Reflexes are normal and symmetric.  Psychiatric:        Behavior: Behavior normal.        Thought Content: Thought content normal.        Judgment: Judgment normal.     BP 116/64   Pulse 88   Ht 5\' 11"  (1.803 m)   Wt 191 lb (86.6 kg)   SpO2 94%   BMI 26.64 kg/m  Wt Readings from Last 3 Encounters:  07/17/20 191 lb (86.6 kg)  12/14/18 190 lb (86.2 kg)  09/22/18 187 lb (84.8 kg)     There are no preventive care reminders to display for this patient.  There are no preventive care reminders to display for this patient.  Lab Results  Component Value Date   TSH 0.47 04/27/2017   Lab Results  Component Value Date   WBC 7.9 04/28/2019   HGB 15.4 04/28/2019   HCT 45.1 04/28/2019   MCV 89.8 04/28/2019   PLT 290 04/28/2019   Lab Results  Component Value Date   NA 138 04/28/2019   K 3.5 04/28/2019   CO2 27 04/28/2019   GLUCOSE 106 (H) 04/28/2019   BUN 8 04/28/2019   CREATININE 0.84 04/28/2019   BILITOT 0.3 04/28/2019   ALKPHOS 68 04/28/2019   AST 23 04/28/2019   ALT 21 04/28/2019   PROT 7.5 04/28/2019   ALBUMIN 4.3 04/28/2019   CALCIUM 8.9 04/28/2019   ANIONGAP 10 04/28/2019   Lab Results   Component Value Date   CHOL 163 10/06/2014   Lab Results  Component Value Date   HDL 38 10/06/2014   Lab Results  Component Value Date   LDLCALC 98 10/06/2014   Lab Results  Component Value Date   TRIG 136 10/06/2014   Lab Results  Component Value Date   CHOLHDL 4.4 08/07/2014   Lab Results  Component Value Date   HGBA1C 5.5 10/06/2014      Assessment & Plan:   Problem List Items Addressed This Visit      Cardiovascular and Mediastinum   Syncope    Recurrent every 1-2 mo. unclear etiology at this point.  Could be some type of arrhythmia, electrolyte disturbance etc.  Blood pressure looks phenomenal today.  EKG shows rate of 72 bpm, normal sinus rhythm no acute ST-T wave changes.  We will schedule for echocardiogram and cardiac event monitor.      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  Your vaccines are up to date.  Due for CMP, lipid, PSA, and CBC, ferritin  No orders of the defined types were placed in this encounter.   Follow-up: Return in about 4 weeks (around 08/14/2020) for Memory and syncope.    Beatrice Lecher, MD

## 2020-07-17 NOTE — Progress Notes (Signed)
He reports that he had labs done this morning at his wife's job.

## 2020-07-17 NOTE — Assessment & Plan Note (Signed)
She feels that there has been a progression in his memory loss that is been gradual over the last several years we will move forward with MRI for further work-up consider neurology referral

## 2020-07-18 NOTE — Telephone Encounter (Signed)
Addressed during appt.

## 2020-07-19 NOTE — Addendum Note (Signed)
Addended by: Narda Rutherford on: 07/19/2020 07:54 AM   Modules accepted: Orders

## 2020-07-27 ENCOUNTER — Encounter: Payer: Self-pay | Admitting: Family Medicine

## 2020-07-27 DIAGNOSIS — R55 Syncope and collapse: Secondary | ICD-10-CM

## 2020-07-27 DIAGNOSIS — S069X9S Unspecified intracranial injury with loss of consciousness of unspecified duration, sequela: Secondary | ICD-10-CM

## 2020-07-27 DIAGNOSIS — R413 Other amnesia: Secondary | ICD-10-CM

## 2020-07-30 NOTE — Telephone Encounter (Signed)
Thank you MRI and orders placed.

## 2020-07-30 NOTE — Telephone Encounter (Signed)
Dr Madilyn Fireman, I am not seeing orders for these.   I have pended referral for cardiology for heart monitor and also order for echo, can you please review and sign those if they are correct?

## 2020-07-31 ENCOUNTER — Ambulatory Visit (INDEPENDENT_AMBULATORY_CARE_PROVIDER_SITE_OTHER): Payer: Federal, State, Local not specified - PPO

## 2020-07-31 ENCOUNTER — Other Ambulatory Visit: Payer: Self-pay

## 2020-07-31 DIAGNOSIS — S069X9S Unspecified intracranial injury with loss of consciousness of unspecified duration, sequela: Secondary | ICD-10-CM

## 2020-07-31 DIAGNOSIS — R413 Other amnesia: Secondary | ICD-10-CM

## 2020-07-31 DIAGNOSIS — R55 Syncope and collapse: Secondary | ICD-10-CM

## 2020-07-31 MED ORDER — GADOBUTROL 1 MMOL/ML IV SOLN
8.6000 mL | Freq: Once | INTRAVENOUS | Status: AC | PRN
  Administered 2020-07-31: 8.6 mL via INTRAVENOUS

## 2020-08-02 ENCOUNTER — Encounter: Payer: Self-pay | Admitting: Family Medicine

## 2020-08-03 NOTE — Telephone Encounter (Signed)
Patient could not come in today at all. States that it would have to be next week. Appointment was made for Monday with PCP.

## 2020-08-03 NOTE — Telephone Encounter (Signed)
Call patient or his wife broke.  Need to schedule an appointment.  His kidney function looks great several weeks ago at his physical so it is definitely unusual for it to jump up that quickly.  We can see if we have an appointment available today for him.

## 2020-08-06 ENCOUNTER — Ambulatory Visit (INDEPENDENT_AMBULATORY_CARE_PROVIDER_SITE_OTHER): Payer: Federal, State, Local not specified - PPO | Admitting: Family Medicine

## 2020-08-06 ENCOUNTER — Ambulatory Visit (HOSPITAL_BASED_OUTPATIENT_CLINIC_OR_DEPARTMENT_OTHER): Payer: Federal, State, Local not specified - PPO

## 2020-08-06 ENCOUNTER — Encounter: Payer: Self-pay | Admitting: Family Medicine

## 2020-08-06 ENCOUNTER — Other Ambulatory Visit: Payer: Self-pay

## 2020-08-06 VITALS — BP 103/69 | HR 79 | Ht 71.0 in | Wt 183.0 lb

## 2020-08-06 DIAGNOSIS — M545 Low back pain, unspecified: Secondary | ICD-10-CM

## 2020-08-06 DIAGNOSIS — R7989 Other specified abnormal findings of blood chemistry: Secondary | ICD-10-CM | POA: Diagnosis not present
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
## 2020-08-06 NOTE — Assessment & Plan Note (Signed)
Unclear etiology at this point.  He had been taking ibuprofen for couple days but nothing that should have caused that significant of a change in his serum creatinine.  Prior test was from about a year ago.  No other changes to suggest a potential cause.  He hydrates very well in fact he says he drinks almost a gallon of water daily.  We will start by just rechecking as well as doing a sed rate and also can do a urinalysis to look for protein and blood loss.  If repeat creatinine looks great then we will just continue to monitor if it still elevated then will get renal ultrasound and further lab work.

## 2020-08-07 ENCOUNTER — Other Ambulatory Visit: Payer: Self-pay | Admitting: *Deleted

## 2020-08-07 ENCOUNTER — Encounter: Payer: Self-pay | Admitting: Family Medicine

## 2020-08-07 ENCOUNTER — Telehealth: Payer: Self-pay | Admitting: Radiology

## 2020-08-07 DIAGNOSIS — R55 Syncope and collapse: Secondary | ICD-10-CM

## 2020-08-07 DIAGNOSIS — R7989 Other specified abnormal findings of blood chemistry: Secondary | ICD-10-CM

## 2020-08-07 NOTE — Telephone Encounter (Signed)
Enrolled patient for a 14 day Zio AT monitor to be mailed to patients home. Brief instructions were gone over with patient and he knows to expect the monitor to arrive in 3-4 days.

## 2020-08-08 ENCOUNTER — Other Ambulatory Visit: Payer: Self-pay

## 2020-08-08 ENCOUNTER — Ambulatory Visit (INDEPENDENT_AMBULATORY_CARE_PROVIDER_SITE_OTHER): Payer: Federal, State, Local not specified - PPO

## 2020-08-08 DIAGNOSIS — R7989 Other specified abnormal findings of blood chemistry: Secondary | ICD-10-CM

## 2020-08-09 ENCOUNTER — Encounter: Payer: Self-pay | Admitting: Family Medicine

## 2020-08-10 ENCOUNTER — Other Ambulatory Visit: Payer: Self-pay | Admitting: *Deleted

## 2020-08-10 DIAGNOSIS — R7989 Other specified abnormal findings of blood chemistry: Secondary | ICD-10-CM

## 2020-08-13 ENCOUNTER — Encounter: Payer: Self-pay | Admitting: Family Medicine

## 2020-08-13 ENCOUNTER — Ambulatory Visit: Payer: Federal, State, Local not specified - PPO | Admitting: Family Medicine

## 2020-08-13 LAB — CYSTATIN C: CYSTATIN C: 1.38

## 2020-08-13 LAB — CHG IRON BINDING CAPACITY
ANA Direct: NEGATIVE
SED RATE BY MODIFIED WESTERGREN,MANUAL: 2

## 2020-08-20 ENCOUNTER — Telehealth: Payer: Self-pay | Admitting: *Deleted

## 2020-08-20 NOTE — Telephone Encounter (Signed)
[employer] called over weekend.  Patients 14 day ZIO AT monitor fell off after 3 days.  Reference # [id number].  I called [employer] this morning and authorized Lattie Haw to ship out a replacement monitor.  Charles Crosby has been notified and given tips to achieve a longer wear time.

## 2020-08-29 ENCOUNTER — Ambulatory Visit (HOSPITAL_BASED_OUTPATIENT_CLINIC_OR_DEPARTMENT_OTHER): Payer: Federal, State, Local not specified - PPO

## 2020-09-06 ENCOUNTER — Encounter: Payer: Self-pay | Admitting: Family Medicine

## 2020-09-06 NOTE — Telephone Encounter (Signed)
Can we check on this referral and give them an update. It has been 4 weeks.

## 2020-09-07 NOTE — Telephone Encounter (Signed)
Yes he had them drawn at outside lab so it may be in a couple of different myChart message.

## 2020-09-12 ENCOUNTER — Other Ambulatory Visit: Payer: Self-pay

## 2020-09-12 ENCOUNTER — Ambulatory Visit (HOSPITAL_BASED_OUTPATIENT_CLINIC_OR_DEPARTMENT_OTHER)
Admission: RE | Admit: 2020-09-12 | Discharge: 2020-09-12 | Disposition: A | Discharge status: Home / Self Care | Payer: Federal, State, Local not specified - PPO | Source: Ambulatory Visit | Attending: Family Medicine | Admitting: Family Medicine

## 2020-09-12 DIAGNOSIS — R55 Syncope and collapse: Secondary | ICD-10-CM

## 2020-09-12 LAB — ECHOCARDIOGRAM COMPLETE
Area-P 1/2: 3.34 cm2
S' Lateral: 2.87 cm

## 2021-06-06 DIAGNOSIS — F329 Major depressive disorder, single episode, unspecified: Secondary | ICD-10-CM | POA: Insufficient documentation

## 2021-12-16 ENCOUNTER — Telehealth: Payer: Self-pay | Admitting: General Practice

## 2021-12-16 NOTE — Telephone Encounter (Signed)
Transition Care Management Follow-up Telephone Call Date of discharge and from where: 12/15/21 from [facility] How have you been since you were released from the hospital? Doing better. He is going for inpatient rehab for 3.5 months in [place]. He leaves Wednesday. I confirmed and we are still his PCP. He will call back to make an appointment to follow up with Dr. Madilyn Fireman once he is back. Any questions or concerns? No  Items Reviewed: Did the pt receive and understand the discharge instructions provided? Yes  Medications obtained and verified? No  Other? No  Any new allergies since your discharge? No  Dietary orders reviewed? Yes Do you have support at home? Yes   Home Care and Equipment/Supplies: Were home health services ordered? no  Functional Questionnaire: (I = Independent and D = Dependent) ADLs: I  Bathing/Dressing- I  Meal Prep- I  Eating- I  Maintaining continence- I  Transferring/Ambulation- I  Managing Meds- I  Follow up appointments reviewed:  PCP Hospital f/u appt confirmed? No  Patient does want to make an appointment. He will call back once he is back in [facility] f/u appt confirmed?  Patient is going for an inpatient rehab ([facility]) for 3.5 months on Wednesday to [place]. Are transportation arrangements needed? No  If their condition worsens, is the pt aware to call PCP or go to the Emergency Dept.? Yes Was the patient provided with contact information for the PCP's office or ED? Yes Was to pt encouraged to call back with questions or concerns? Yes

## 2022-02-10 DIAGNOSIS — E291 Testicular hypofunction: Secondary | ICD-10-CM | POA: Diagnosis not present

## 2022-02-10 DIAGNOSIS — R413 Other amnesia: Secondary | ICD-10-CM | POA: Diagnosis not present

## 2022-02-10 DIAGNOSIS — F419 Anxiety disorder, unspecified: Secondary | ICD-10-CM | POA: Diagnosis not present

## 2022-02-10 DIAGNOSIS — S069XAA Unspecified intracranial injury with loss of consciousness status unknown, initial encounter: Secondary | ICD-10-CM | POA: Diagnosis not present

## 2022-02-10 DIAGNOSIS — Z131 Encounter for screening for diabetes mellitus: Secondary | ICD-10-CM | POA: Diagnosis not present

## 2022-02-10 DIAGNOSIS — Z13828 Encounter for screening for other musculoskeletal disorder: Secondary | ICD-10-CM | POA: Diagnosis not present

## 2022-02-10 DIAGNOSIS — E538 Deficiency of other specified B group vitamins: Secondary | ICD-10-CM | POA: Diagnosis not present

## 2022-03-19 ENCOUNTER — Encounter: Payer: Self-pay | Admitting: Family Medicine

## 2022-03-19 ENCOUNTER — Ambulatory Visit (INDEPENDENT_AMBULATORY_CARE_PROVIDER_SITE_OTHER): Payer: Federal, State, Local not specified - PPO | Admitting: Family Medicine

## 2022-03-19 VITALS — BP 123/69 | HR 84 | Ht 71.0 in | Wt 191.0 lb

## 2022-03-19 DIAGNOSIS — Z0000 Encounter for general adult medical examination without abnormal findings: Secondary | ICD-10-CM | POA: Diagnosis not present

## 2022-03-19 DIAGNOSIS — R7301 Impaired fasting glucose: Secondary | ICD-10-CM | POA: Insufficient documentation

## 2022-03-19 DIAGNOSIS — F332 Major depressive disorder, recurrent severe without psychotic features: Secondary | ICD-10-CM | POA: Diagnosis not present

## 2022-03-19 DIAGNOSIS — F4310 Post-traumatic stress disorder, unspecified: Secondary | ICD-10-CM

## 2022-03-19 DIAGNOSIS — R7989 Other specified abnormal findings of blood chemistry: Secondary | ICD-10-CM

## 2022-03-19 MED ORDER — ZIPRASIDONE HCL 80 MG PO CAPS: 80.0000 mg | ORAL_CAPSULE | Freq: Every day | ORAL | 0 refills | Status: DC

## 2022-03-19 MED ORDER — LAMOTRIGINE 100 MG PO TABS: 100.0000 mg | ORAL_TABLET | Freq: Every evening | ORAL | 0 refills | Status: DC

## 2022-03-19 NOTE — Progress Notes (Signed)
? ?Complete physical exam ? ?Patient: Charles Crosby   DOB: 07/24/1976   46 y.o. Male  MRN: [id number] ? ?Subjective:  ?  ?Chief Complaint  ?Patient presents with  ? Annual Exam  ? ? ?Charles Crosby is a 46 y.o. male who presents today for a complete physical exam. He reports consuming a general diet. Home exercise routine includes home gym 5 days per week. He generally feels well. Marland Kitchen He does have additional problems to discuss today.  ? ?Brought in a copy of labs performed recently in March.  He is currently getting testosterone hormone pellets. ? ?Also let me know that he quit vaping recently which is fantastic. ? ? ?Most recent fall risk assessment: ? ?  03/19/2022  ?  9:09 AM  ?Fall Risk   ?Falls in the past year? 0  ?Number falls in past yr: 0  ?Injury with Fall? 0  ?Risk for fall due to : No Fall Risks  ?Follow up Follow up appointment  ? ?  ?Most recent depression screenings: ? ?  03/19/2022  ?  9:42 AM 03/19/2022  ?  9:09 AM  ?PHQ 2/9 Scores  ?PHQ - 2 Score 1 0  ?PHQ- 9 Score 5   ? ? ? ? ? ? ?Patient Care Team: ?Hali Marry, MD as PCP - General (Family Medicine)  ? ?Outpatient Medications Prior to Visit  ?Medication Sig  ? AMBULATORY NON FORMULARY MEDICATION Medication Name: CPAP with mask, tubing, and humidifier.  Dx OSA with AHI of 21.  Set to Autopap and then download after one week so we can set his pressure. Range of auto pressure: 4-20 CmH2O.  ? Multiple Vitamin (MULTIVITAMIN WITH MINERALS) TABS tablet Take 1 tablet by mouth daily.  ? sildenafil (REVATIO) 20 MG tablet SMARTSIG:0-5 Tablet(s) By Mouth  ? [DISCONTINUED] lamoTRIgine (LAMICTAL) 100 MG tablet Take 1 tablet (100 mg total) by mouth every evening.  ? [DISCONTINUED] ziprasidone (GEODON) 80 MG capsule Take 80 mg by mouth daily.  ? ?No facility-administered medications prior to visit.  ? ? ?ROS ? ? ? ? ?   ?Objective:  ? ?  ?BP 123/69   Pulse 84   Ht '5\' 11"'$  (1.803 m)   Wt 191 lb (86.6 kg)   SpO2 98%   BMI 26.64 kg/m?  ? ? ?Physical Exam   ? ?No results found for any visits on 03/19/22. ? ?   ?Assessment & Plan:  ?  ?Routine Health Maintenance and Physical Exam ? ?Immunization History  ?Administered Date(s) Administered  ? Hep A / Hep B 06/06/2009  ? Hepatitis A 06/06/2009  ? Hepatitis B 06/06/2009  ? Influenza,inj,Quad PF,6+ Mos 08/04/2014, 07/06/2015  ? Td 11/17/2000  ? ? ?Health Maintenance  ?Topic Date Due  ? COVID-19 Vaccine (1) 04/04/2024 (Originally 01/29/1977)  ? HIV Screening  11/16/2029 (Originally 08/02/1991)  ? INFLUENZA VACCINE  06/17/2022  ? TETANUS/TDAP  07/26/2022  ? COLONOSCOPY (Pts 45-60yr Insurance coverage will need to be confirmed)  03/29/2029  ? Hepatitis C Screening  Completed  ? Pneumococcal Vaccine [id number] old  Aged Out  ? HPV VACCINES  Aged Out  ? ? ?Discussed health benefits of physical activity, and encouraged him to engage in regular exercise appropriate for his age and condition. ? ?Problem List Items Addressed This Visit   ? ?  ? Endocrine  ? IFG (impaired fasting glucose)  ?  1C on recent labs at 5.9.  We discussed what this lab result means.  Encouraged  him to work on cutting back on intake of sweets and portion controlling his carbs a little better.  He already exercises regularly so really putting the focus on his diet is important.  Recommend repeating his A1c in about 90 days after starting to make some changes to see if he can improve his A1c and optimally get it back under 5.7 if possible.  Reports family history of diabetes in his father. ? ?  ?  ?  ? Other  ? Post traumatic stress disorder (PTSD)  ? Relevant Medications  ? ziprasidone (GEODON) 80 MG capsule  ? lamoTRIgine (LAMICTAL) 100 MG tablet  ? Other Relevant Orders  ? Ambulatory referral to Psychiatry  ? MDD (major depressive disorder), recurrent severe, without psychosis ([place])  ?  His psychiatrist has retired and contacted me personally in regards to taking over his medications and then working on getting him in with a new psychiatrist.  Medication  refills sent to pharmacy.  We will go ahead and place a new referral for psychiatry as well.  He is currently on Geodon 80 mg and Lamictal 100 mg.  He has been on this regimen for a while no recent changes. ? ?  ?  ? Relevant Medications  ? ziprasidone (GEODON) 80 MG capsule  ? lamoTRIgine (LAMICTAL) 100 MG tablet  ? Other Relevant Orders  ? Ambulatory referral to Psychiatry  ? Elevated serum creatinine  ?  He still has a elevated serum creatinine at 1.3 he does take creatine for his workouts which can certainly falsely elevate that level.  It has been elevated on and off since at least 2021.  Improved from lab in January when it was 1.4.  We will continue to monitor carefully. ? ?  ?  ? ?Other Visit Diagnoses   ? ? Wellness examination    -  Primary  ? ?  ? ? ?Keep up a regular exercise program and make sure you are eating a healthy diet ?Try to eat 4 servings of dairy a day, or if you are lactose intolerant take a calcium with vitamin D daily.  ?Your vaccines are up to date.  ?Congratulated him on stopping vaping. ?Viewed lab results with him in particular his elevated A1c level. ?Minded him to get his Tdap vaccine this fall as well as his flu vaccine. ? ?No follow-ups on file. ? ?  ? ?Beatrice Lecher, MD ? ? ?

## 2022-03-19 NOTE — Assessment & Plan Note (Signed)
His psychiatrist has retired and contacted me personally in regards to taking over his medications and then working on getting him in with a new psychiatrist.  Medication refills sent to pharmacy.  We will go ahead and place a new referral for psychiatry as well.  He is currently on Geodon 80 mg and Lamictal 100 mg.  He has been on this regimen for a while no recent changes. ?

## 2022-03-19 NOTE — Patient Instructions (Signed)
Please check your hemoglobin A1c again in 90 days after you start making some changes.  Goal is to get it back under 5.7. ?You will need an updated Tdap vaccine in September. ? ?

## 2022-03-19 NOTE — Assessment & Plan Note (Addendum)
He still has a elevated serum creatinine at 1.3 he does take creatine for his workouts which can certainly falsely elevate that level.  It has been elevated on and off since at least 2021.  Improved from lab in January when it was 1.4.  We will continue to monitor carefully. ?

## 2022-03-19 NOTE — Assessment & Plan Note (Signed)
1C on recent labs at 5.9.  We discussed what this lab result means.  Encouraged him to work on cutting back on intake of sweets and portion controlling his carbs a little better.  He already exercises regularly so really putting the focus on his diet is important.  Recommend repeating his A1c in about 90 days after starting to make some changes to see if he can improve his A1c and optimally get it back under 5.7 if possible.  Reports family history of diabetes in his father. ?

## 2022-05-14 ENCOUNTER — Telehealth (HOSPITAL_COMMUNITY): Payer: Non-veteran care | Admitting: Psychiatry

## 2022-06-12 ENCOUNTER — Encounter (HOSPITAL_COMMUNITY): Payer: Self-pay

## 2022-06-12 ENCOUNTER — Telehealth (HOSPITAL_COMMUNITY): Payer: Non-veteran care | Admitting: Psychiatry

## 2022-07-01 ENCOUNTER — Other Ambulatory Visit: Payer: Self-pay | Admitting: Family Medicine

## 2022-07-01 DIAGNOSIS — F4310 Post-traumatic stress disorder, unspecified: Secondary | ICD-10-CM

## 2022-07-01 DIAGNOSIS — F332 Major depressive disorder, recurrent severe without psychotic features: Secondary | ICD-10-CM

## 2022-07-02 MED ORDER — ZIPRASIDONE HCL 80 MG PO CAPS: 80.0000 mg | ORAL_CAPSULE | Freq: Every day | ORAL | 0 refills | Status: DC

## 2022-07-02 MED ORDER — LAMOTRIGINE 100 MG PO TABS: 100.0000 mg | ORAL_TABLET | Freq: Every evening | ORAL | 0 refills | Status: DC

## 2022-07-11 ENCOUNTER — Ambulatory Visit: Payer: Federal, State, Local not specified - PPO | Admitting: Family Medicine

## 2022-07-11 NOTE — Progress Notes (Deleted)
   Established Patient Office Visit  Subjective   Patient ID: CLARK CUFF, male    DOB: 1976-06-29  Age: 46 y.o. MRN: [id number]  No chief complaint on file.   HPI  F/U Mood -   Impaired fasting glucose-no increased thirst or urination. No symptoms consistent with hypoglycemia.   {History (Optional):23778}  ROS    Objective:     There were no vitals taken for this visit. {Vitals History (Optional):23777}  Physical Exam   No results found for any visits on 07/11/22.  {Labs (Optional):23779}  The 10-year ASCVD risk score (Arnett DK, et al., 2019) is: 2.3%    Assessment & Plan:   Problem List Items Addressed This Visit       Endocrine   IFG (impaired fasting glucose) - Primary    No follow-ups on file.    Beatrice Lecher, MD

## 2022-07-30 ENCOUNTER — Ambulatory Visit: Payer: Federal, State, Local not specified - PPO | Admitting: Family Medicine

## 2022-07-30 NOTE — Progress Notes (Deleted)
   Established Patient Office Visit  Subjective   Patient ID: Charles Crosby, male    DOB: [birth year]  Age: 46 y.o. MRN: [id number]  No chief complaint on file.   HPI  Follow-up major depressive disorder.  Currently on Geodon 80 mg and Lamictal 100 mg.  Also has PTSD.  Last time I saw him his psychiatrist had retired and so he was trying to get established with a new provider.  Impaired fasting glucose-no increased thirst or urination. No symptoms consistent with hypoglycemia.   {History (Optional):23778}  ROS    Objective:     There were no vitals taken for this visit. {Vitals History (Optional):23777}  Physical Exam Constitutional:      Appearance: He is well-developed.  HENT:     Head: Normocephalic and atraumatic.  Cardiovascular:     Rate and Rhythm: Normal rate and regular rhythm.     Heart sounds: Normal heart sounds.  Pulmonary:     Effort: Pulmonary effort is normal.     Breath sounds: Normal breath sounds.  Skin:    General: Skin is warm and dry.  Neurological:     Mental Status: He is alert and oriented to person, place, and time.  Psychiatric:        Behavior: Behavior normal.      No results found for any visits on 07/30/22.  {Labs (Optional):23779}  The 10-year ASCVD risk score (Arnett DK, et al., 2019) is: 2.3%    Assessment & Plan:   Problem List Items Addressed This Visit       Endocrine   IFG (impaired fasting glucose) - Primary     Other   MDD (major depressive disorder), recurrent severe, without psychosis ([place])    No follow-ups on file.    Beatrice Lecher, MD

## 2022-09-07 ENCOUNTER — Other Ambulatory Visit: Payer: Self-pay | Admitting: Family Medicine

## 2022-09-07 DIAGNOSIS — F332 Major depressive disorder, recurrent severe without psychotic features: Secondary | ICD-10-CM

## 2022-09-07 DIAGNOSIS — F4310 Post-traumatic stress disorder, unspecified: Secondary | ICD-10-CM

## 2022-09-09 ENCOUNTER — Other Ambulatory Visit: Payer: Self-pay | Admitting: Family Medicine

## 2022-09-09 DIAGNOSIS — F332 Major depressive disorder, recurrent severe without psychotic features: Secondary | ICD-10-CM

## 2022-09-09 DIAGNOSIS — F4310 Post-traumatic stress disorder, unspecified: Secondary | ICD-10-CM

## 2022-09-09 NOTE — Telephone Encounter (Signed)
Please call pt and see what is going on he was supposed to establ with psychiatry. I haven't seen him in 5 months.

## 2022-09-10 ENCOUNTER — Other Ambulatory Visit: Payer: Self-pay | Admitting: Family Medicine

## 2022-09-10 ENCOUNTER — Encounter: Payer: Self-pay | Admitting: Family Medicine

## 2022-09-10 DIAGNOSIS — F4310 Post-traumatic stress disorder, unspecified: Secondary | ICD-10-CM

## 2022-09-10 DIAGNOSIS — F332 Major depressive disorder, recurrent severe without psychotic features: Secondary | ICD-10-CM

## 2022-09-10 MED ORDER — ZIPRASIDONE HCL 80 MG PO CAPS: 80.0000 mg | ORAL_CAPSULE | Freq: Every day | ORAL | 0 refills | Status: DC

## 2022-09-10 MED ORDER — LAMOTRIGINE 100 MG PO TABS: 100.0000 mg | ORAL_TABLET | Freq: Every evening | ORAL | 0 refills | Status: DC

## 2022-09-10 NOTE — Telephone Encounter (Signed)
Patient advised.

## 2022-09-10 NOTE — Telephone Encounter (Signed)
Okay,  he needs to call back psychiatry and get scheduled for an appointment.  30-day supplies sent all medications.  He can then let us know when his appointment is.

## 2022-09-24 ENCOUNTER — Other Ambulatory Visit: Payer: Self-pay | Admitting: Family Medicine

## 2022-09-24 DIAGNOSIS — F4310 Post-traumatic stress disorder, unspecified: Secondary | ICD-10-CM

## 2022-09-24 DIAGNOSIS — F332 Major depressive disorder, recurrent severe without psychotic features: Secondary | ICD-10-CM

## 2022-09-30 ENCOUNTER — Other Ambulatory Visit: Payer: Self-pay | Admitting: Family Medicine

## 2022-09-30 DIAGNOSIS — F332 Major depressive disorder, recurrent severe without psychotic features: Secondary | ICD-10-CM

## 2022-09-30 DIAGNOSIS — F4310 Post-traumatic stress disorder, unspecified: Secondary | ICD-10-CM

## 2022-10-01 ENCOUNTER — Other Ambulatory Visit: Payer: Self-pay | Admitting: Family Medicine

## 2022-10-01 DIAGNOSIS — F332 Major depressive disorder, recurrent severe without psychotic features: Secondary | ICD-10-CM

## 2022-10-01 DIAGNOSIS — F4310 Post-traumatic stress disorder, unspecified: Secondary | ICD-10-CM

## 2022-10-08 ENCOUNTER — Encounter: Payer: Self-pay | Admitting: Family Medicine

## 2022-10-08 ENCOUNTER — Other Ambulatory Visit: Payer: Self-pay | Admitting: Family Medicine

## 2022-10-08 DIAGNOSIS — F4310 Post-traumatic stress disorder, unspecified: Secondary | ICD-10-CM

## 2022-10-08 DIAGNOSIS — F332 Major depressive disorder, recurrent severe without psychotic features: Secondary | ICD-10-CM

## 2022-10-08 MED ORDER — LAMOTRIGINE 100 MG PO TABS: 100.0000 mg | ORAL_TABLET | Freq: Every evening | ORAL | 0 refills | Status: DC

## 2022-10-08 MED ORDER — ZIPRASIDONE HCL 80 MG PO CAPS: 80.0000 mg | ORAL_CAPSULE | Freq: Every day | ORAL | 0 refills | Status: DC

## 2022-10-20 DIAGNOSIS — R413 Other amnesia: Secondary | ICD-10-CM | POA: Diagnosis not present

## 2022-10-20 DIAGNOSIS — E291 Testicular hypofunction: Secondary | ICD-10-CM | POA: Diagnosis not present

## 2022-10-20 DIAGNOSIS — E349 Endocrine disorder, unspecified: Secondary | ICD-10-CM | POA: Diagnosis not present

## 2022-10-20 DIAGNOSIS — S069XAA Unspecified intracranial injury with loss of consciousness status unknown, initial encounter: Secondary | ICD-10-CM | POA: Diagnosis not present

## 2022-10-20 DIAGNOSIS — E538 Deficiency of other specified B group vitamins: Secondary | ICD-10-CM | POA: Diagnosis not present

## 2022-10-20 DIAGNOSIS — F419 Anxiety disorder, unspecified: Secondary | ICD-10-CM | POA: Diagnosis not present

## 2022-10-20 LAB — VITAMIN D 25 HYDROXY (VIT D DEFICIENCY, FRACTURES): Vit D, 25-Hydroxy: 37

## 2022-10-20 LAB — HEPATIC FUNCTION PANEL
ALT: 101 U/L — AB (ref 10–40)
AST: 72 — AB (ref 14–40)
Alkaline Phosphatase: 76 (ref 25–125)
Bilirubin, Total: 0

## 2022-10-20 LAB — BASIC METABOLIC PANEL
BUN: 18 (ref 4–21)
CO2: 30 — AB (ref 13–22)
Chloride: 97 — AB (ref 99–108)
Creatinine: 1.3 (ref 0.6–1.3)
Glucose: 131
Potassium: 4.1 mEq/L (ref 3.5–5.1)
Sodium: 137 (ref 137–147)

## 2022-10-20 LAB — CBC AND DIFFERENTIAL
HCT: 47 (ref 41–53)
Hemoglobin: 15.2 (ref 13.5–17.5)
Platelets: 228 10*3/uL (ref 150–400)
WBC: 4.6

## 2022-10-20 LAB — IRON,TIBC AND FERRITIN PANEL: Ferritin: 279

## 2022-10-20 LAB — VITAMIN B12: Vitamin B-12: 1232

## 2022-10-20 LAB — COMPREHENSIVE METABOLIC PANEL
Albumin: 4.5 (ref 3.5–5.0)
Calcium: 9.9 (ref 8.7–10.7)

## 2022-10-20 LAB — LAB REPORT - SCANNED
CRP: 3
DHEA: 266
Free T3: 3.29
Free T4: 1.12

## 2022-10-20 LAB — TESTOSTERONE: Testosterone: 285

## 2022-10-20 LAB — TSH: TSH: 0.63 (ref 0.41–5.90)

## 2022-10-20 LAB — HEMOGLOBIN A1C: Hemoglobin A1C: 5.7

## 2022-10-20 LAB — PSA: PSA: 1.52

## 2022-10-20 LAB — CBC: RBC: 5.07 (ref 3.87–5.11)

## 2022-10-23 ENCOUNTER — Encounter: Payer: Self-pay | Admitting: Family Medicine

## 2022-10-23 ENCOUNTER — Ambulatory Visit (INDEPENDENT_AMBULATORY_CARE_PROVIDER_SITE_OTHER): Payer: Federal, State, Local not specified - PPO | Admitting: Family Medicine

## 2022-10-23 VITALS — BP 112/60 | HR 92 | Ht 71.0 in | Wt 186.0 lb

## 2022-10-23 DIAGNOSIS — R7989 Other specified abnormal findings of blood chemistry: Secondary | ICD-10-CM | POA: Diagnosis not present

## 2022-10-23 DIAGNOSIS — R55 Syncope and collapse: Secondary | ICD-10-CM

## 2022-10-23 DIAGNOSIS — G4733 Obstructive sleep apnea (adult) (pediatric): Secondary | ICD-10-CM

## 2022-10-23 DIAGNOSIS — R748 Abnormal levels of other serum enzymes: Secondary | ICD-10-CM

## 2022-10-23 DIAGNOSIS — R7301 Impaired fasting glucose: Secondary | ICD-10-CM

## 2022-10-23 DIAGNOSIS — R42 Dizziness and giddiness: Secondary | ICD-10-CM

## 2022-10-23 NOTE — Assessment & Plan Note (Signed)
1C on recent labs indicate prediabetes with an A1c of 5.7.  Work on cutting back on sweets and carbs plan to recheck again in 6 months.

## 2022-10-23 NOTE — Telephone Encounter (Signed)
Please abstract these labs.

## 2022-10-23 NOTE — Progress Notes (Signed)
Acute Office Visit  Subjective:     Patient ID: Charles Crosby, male    DOB: 03/29/1976, 46 y.o.   MRN: [id number]  Chief Complaint  Patient presents with   Loss of Consciousness    HPI Patient is in today for near syncope.  NO SOB or chest pain or fever.  No new supplements.  Last episode was a couple fo days ago.  Not using his CPAP.     He says for a while now he has had daily symptoms of feeling "lightheaded" he says it does not feel like he is dizzy or off balance.  But twice has had more intense episodes once where his wife reports that he actually passed out for about 30 seconds.  A second time where she can tell he was starting to look really pale and they were seen in the bedroom and he had to go ahead and lay down and put his feet up and so he never actually passed out.  And then the last time it happened was yesterday.  His wife also reports that often times he only eats once a day but more recently has been trying to get breakfast in.  He does does a great job with staying well-hydrated.  No abnormal weight loss.  No fever or chills.  No recent illnesses.  For months though he has felt more cold and felt like he was freezing but yet occasionally would have some sweats.  Hx of recurrent alcohol abuse.  He quit about 2 weeks ago.  Blood work drawn yesterday.  Results attached in a MyChart note sent earlier today.  Abnormal lab results included elevated ferritin, elevated AST and ALT enzymes, low testosterone, normal TSH, normal hemoglobin.   ROS      Objective:    BP 112/60   Pulse 92   Ht '5\' 11"'$  (1.803 m)   Wt 186 lb (84.4 kg)   SpO2 100%   BMI 25.94 kg/m    Physical Exam Constitutional:      Appearance: He is well-developed.  HENT:     Head: Normocephalic and atraumatic.     Right Ear: External ear normal.     Left Ear: External ear normal.     Ears:     Comments: Ems both are blocked by cerumen.  I could just visualize a small portion of the right TM which  appears to be clear.    Mouth/Throat:     Pharynx: Oropharynx is clear.  Eyes:     Conjunctiva/sclera: Conjunctivae normal.  Neck:     Vascular: No carotid bruit.  Cardiovascular:     Rate and Rhythm: Normal rate and regular rhythm.     Heart sounds: Normal heart sounds.  Pulmonary:     Effort: Pulmonary effort is normal.     Breath sounds: Normal breath sounds.  Abdominal:     General: Bowel sounds are normal. There is no distension.     Palpations: Abdomen is soft.     Tenderness: There is no abdominal tenderness.  Musculoskeletal:     Cervical back: Neck supple.  Skin:    General: Skin is warm and dry.  Neurological:     Mental Status: He is alert and oriented to person, place, and time.  Psychiatric:        Behavior: Behavior normal.     No results found for any visits on 10/23/22.      Assessment & Plan:   Problem List Items Addressed  This Visit       Respiratory   OSA (obstructive sleep apnea)     Endocrine   IFG (impaired fasting glucose)    1C on recent labs indicate prediabetes with an A1c of 5.7.  Work on cutting back on sweets and carbs plan to recheck again in 6 months.        Other   Syncope   Relevant Orders   ECHOCARDIOGRAM COMPLETE BUBBLE STUDY   US Carotid Duplex Bilateral   EKG 12-Lead   Other Visit Diagnoses     Elevated liver enzymes    -  Primary   Relevant Orders   US ABDOMEN COMPLETE W/ELASTOGRAPHY   Elevated ferritin level       Relevant Orders   Fe+TIBC+Fer   Lightheadedness       Relevant Orders   ECHOCARDIOGRAM COMPLETE BUBBLE STUDY   US Carotid Duplex Bilateral      Syncope with frequent lightheadedness-unclear etiology at this point but I think needs further workup particularly cardiac workup.  Schedule echo and carotid Dopplers.  EKG performed today.  Labs rule out anemia and thyroid disorder. EKG today shows rate of 73 bpm, normal sinus rhythm with no acute ST-T wave changes.  No QT prolongation.  Elevated liver  enzymes will schedule ultrasound with elastography for further workup.  He did quit drinking about 2 weeks ago.  Elevated ferritin-we will check full iron panel.  Consider acute phase reactant versus underlying excess iron storage.  Lightheadedness-continue to make sure eating regularly and staying well-hydrated.  No orders of the defined types were placed in this encounter.   Return in about 4 weeks (around 11/20/2022) for near syncope.  Beatrice Lecher, MD

## 2022-10-24 ENCOUNTER — Ambulatory Visit (HOSPITAL_BASED_OUTPATIENT_CLINIC_OR_DEPARTMENT_OTHER)
Admission: RE | Admit: 2022-10-24 | Discharge: 2022-10-24 | Disposition: A | Discharge status: Home / Self Care | Payer: Federal, State, Local not specified - PPO | Source: Ambulatory Visit | Attending: Family Medicine | Admitting: Family Medicine

## 2022-10-24 DIAGNOSIS — R42 Dizziness and giddiness: Secondary | ICD-10-CM

## 2022-10-24 DIAGNOSIS — R55 Syncope and collapse: Secondary | ICD-10-CM | POA: Diagnosis not present

## 2022-10-24 DIAGNOSIS — I6523 Occlusion and stenosis of bilateral carotid arteries: Secondary | ICD-10-CM | POA: Diagnosis not present

## 2022-10-24 LAB — IRON,TIBC AND FERRITIN PANEL
%SAT: 37 % (calc) (ref 20–48)
Ferritin: 201 ng/mL (ref 38–380)
Iron: 125 ug/dL (ref 50–180)
TIBC: 336 mcg/dL (calc) (ref 250–425)

## 2022-10-24 NOTE — Progress Notes (Signed)
Your lab work is within acceptable range and there are no concerning findings.   ?

## 2022-10-24 NOTE — Telephone Encounter (Signed)
Printed and placed in basket.  

## 2022-10-27 NOTE — Progress Notes (Signed)
Hi Charles Crosby, the Doppler of your carotid arteries in your neck showed just a little bit of plaque buildup called atherosclerosis.  Nothing severe or significant that should be increasing your risk for a blockage or stroke.  So this is very reassuring.

## 2022-10-30 ENCOUNTER — Other Ambulatory Visit: Payer: Federal, State, Local not specified - PPO

## 2022-11-20 ENCOUNTER — Ambulatory Visit: Payer: Federal, State, Local not specified - PPO | Admitting: Family Medicine

## 2022-11-24 ENCOUNTER — Ambulatory Visit: Payer: Federal, State, Local not specified - PPO | Admitting: Family Medicine

## 2022-11-28 ENCOUNTER — Telehealth: Payer: Self-pay | Admitting: Family Medicine

## 2022-11-28 DIAGNOSIS — R059 Cough, unspecified: Secondary | ICD-10-CM | POA: Diagnosis not present

## 2022-11-28 DIAGNOSIS — J019 Acute sinusitis, unspecified: Secondary | ICD-10-CM | POA: Diagnosis not present

## 2022-11-28 NOTE — Telephone Encounter (Signed)
-----  Message from Mertha Finders, [place] sent at 11/28/2022  8:40 AM EST ----- Regarding: FW: ECHO PA FYI- Patient has been scheduled at [facility] to complete Echo study. ----- Message ----- From: Frederic Jericho Sent: 11/27/2022   8:52 AM EST To: Mertha Finders, CMA Subject: RE: ECHO PA                                    We do not know when the next opening will be at [facility] due to staffing of techs. I can get patient in at the [facility] location.  ----- Message ----- From: Mertha Finders, CMA Sent: 11/26/2022   5:28 PM EST To: Carollee Sires Subject: RE: ECHO PA                                    Hello,   Per insurance - no auth or pre-certification for this procedure. Patient will need to be scheduled at [place] [facility]. Call ref# [id number]. Thanks in advance.   Tanna Furry

## 2022-12-08 DIAGNOSIS — Z6372 Alcoholism and drug addiction in family: Secondary | ICD-10-CM | POA: Diagnosis not present

## 2022-12-08 DIAGNOSIS — E8881 Metabolic syndrome: Secondary | ICD-10-CM | POA: Diagnosis not present

## 2022-12-08 DIAGNOSIS — R945 Abnormal results of liver function studies: Secondary | ICD-10-CM | POA: Diagnosis not present

## 2022-12-08 DIAGNOSIS — E291 Testicular hypofunction: Secondary | ICD-10-CM | POA: Diagnosis not present

## 2022-12-08 DIAGNOSIS — F1020 Alcohol dependence, uncomplicated: Secondary | ICD-10-CM | POA: Diagnosis not present

## 2022-12-08 DIAGNOSIS — Z1329 Encounter for screening for other suspected endocrine disorder: Secondary | ICD-10-CM | POA: Diagnosis not present

## 2022-12-22 ENCOUNTER — Ambulatory Visit (HOSPITAL_COMMUNITY): Payer: Federal, State, Local not specified - PPO | Attending: Family Medicine

## 2022-12-22 DIAGNOSIS — R42 Dizziness and giddiness: Secondary | ICD-10-CM

## 2022-12-22 DIAGNOSIS — R55 Syncope and collapse: Secondary | ICD-10-CM

## 2022-12-22 LAB — ECHOCARDIOGRAM COMPLETE BUBBLE STUDY
Area-P 1/2: 3.65 cm2
S' Lateral: 3.2 cm

## 2022-12-22 NOTE — Progress Notes (Signed)
Hi Charles Crosby, the pumping function of your heart is normal at 60 to 65%.  No abnormal motion of the walls.  The valves look like they opening and closing normally.  No sign of any shunting across the heart.  All heart looks great on the echocardiogram.  Any symptoms of feeling lightheaded or passing out then the next option would be to refer you to a cardiologist.

## 2022-12-25 ENCOUNTER — Ambulatory Visit (INDEPENDENT_AMBULATORY_CARE_PROVIDER_SITE_OTHER): Payer: Federal, State, Local not specified - PPO | Admitting: Sports Medicine

## 2022-12-25 DIAGNOSIS — M79602 Pain in left arm: Secondary | ICD-10-CM | POA: Diagnosis not present

## 2022-12-25 MED ORDER — MELOXICAM 15 MG PO TABS: ORAL_TABLET | ORAL | 3 refills | Status: DC

## 2022-12-25 MED ORDER — PREDNISONE 50 MG PO TABS: ORAL_TABLET | ORAL | 0 refills | Status: DC

## 2022-12-25 NOTE — Assessment & Plan Note (Signed)
This is a pleasant 47 year old male, he has not had any injuries or done any physical activity out of the ordinary, for the past week or 2 he has had increasing pain left upper inner arm, down to the left volar thenar eminence. No overt numbness, tingling. He denies any neck pain. On exam he has tenderness in the upper inner arm, axillary exam is negative for lymphadenopathy. No evidence of abscess or skin changes. He also has pain volar thenar eminence, otherwise good motion, good strength, no pain on any of the joints, good motion. No visible swelling. Unclear etiology, although radicular symptoms can certainly go from the upper inner arm to the hand. We will get x-rays of his neck, humerus, left hand, 5 days of prednisone, followed by meloxicam, home conditioning for his neck, he can return to see me in about 4 to 6 weeks to see how things are going.

## 2022-12-25 NOTE — Progress Notes (Signed)
    Procedures performed today:    None.  Independent interpretation of notes and tests performed by another provider:   None.  Brief History, Exam, Impression, and Recommendations:    Left arm pain This is a pleasant 47 year old male, he has not had any injuries or done any physical activity out of the ordinary, for the past week or 2 he has had increasing pain left upper inner arm, down to the left volar thenar eminence. No overt numbness, tingling. He denies any neck pain. On exam he has tenderness in the upper inner arm, axillary exam is negative for lymphadenopathy. No evidence of abscess or skin changes. He also has pain volar thenar eminence, otherwise good motion, good strength, no pain on any of the joints, good motion. No visible swelling. Unclear etiology, although radicular symptoms can certainly go from the upper inner arm to the hand. We will get x-rays of his neck, humerus, left hand, 5 days of prednisone, followed by meloxicam, home conditioning for his neck, he can return to see me in about 4 to 6 weeks to see how things are going.    ____________________________________________ Gwen Her. Dianah Field, M.D., ABFM., CAQSM., AME. Primary Care and Sports Medicine [place] [facility]  Adjunct Professor of [employer]  Risk manager

## 2023-01-03 DIAGNOSIS — Z79899 Other long term (current) drug therapy: Secondary | ICD-10-CM | POA: Diagnosis not present

## 2023-01-03 DIAGNOSIS — R0789 Other chest pain: Secondary | ICD-10-CM | POA: Diagnosis not present

## 2023-01-03 DIAGNOSIS — Z87891 Personal history of nicotine dependence: Secondary | ICD-10-CM | POA: Diagnosis not present

## 2023-01-03 DIAGNOSIS — R0609 Other forms of dyspnea: Secondary | ICD-10-CM | POA: Diagnosis not present

## 2023-01-03 DIAGNOSIS — R0602 Shortness of breath: Secondary | ICD-10-CM | POA: Diagnosis not present

## 2023-01-03 DIAGNOSIS — R079 Chest pain, unspecified: Secondary | ICD-10-CM | POA: Diagnosis not present

## 2023-01-03 DIAGNOSIS — Z885 Allergy status to narcotic agent status: Secondary | ICD-10-CM | POA: Diagnosis not present

## 2023-01-07 ENCOUNTER — Other Ambulatory Visit: Payer: Self-pay | Admitting: Family Medicine

## 2023-01-07 DIAGNOSIS — F4310 Post-traumatic stress disorder, unspecified: Secondary | ICD-10-CM

## 2023-01-07 DIAGNOSIS — F332 Major depressive disorder, recurrent severe without psychotic features: Secondary | ICD-10-CM

## 2023-01-08 MED ORDER — LAMOTRIGINE 100 MG PO TABS: 100.0000 mg | ORAL_TABLET | Freq: Every evening | ORAL | 0 refills | Status: DC

## 2023-01-08 MED ORDER — ZIPRASIDONE HCL 80 MG PO CAPS: 80.0000 mg | ORAL_CAPSULE | Freq: Every day | ORAL | 0 refills | Status: DC

## 2023-01-22 ENCOUNTER — Ambulatory Visit: Payer: Federal, State, Local not specified - PPO | Admitting: Sports Medicine

## 2023-03-16 ENCOUNTER — Ambulatory Visit (INDEPENDENT_AMBULATORY_CARE_PROVIDER_SITE_OTHER): Payer: Federal, State, Local not specified - PPO | Admitting: Family Medicine

## 2023-03-16 ENCOUNTER — Encounter: Payer: Self-pay | Admitting: Family Medicine

## 2023-03-16 VITALS — BP 121/63 | HR 97 | Ht 71.0 in | Wt 188.0 lb

## 2023-03-16 DIAGNOSIS — Z131 Encounter for screening for diabetes mellitus: Secondary | ICD-10-CM | POA: Diagnosis not present

## 2023-03-16 DIAGNOSIS — G43901 Migraine, unspecified, not intractable, with status migrainosus: Secondary | ICD-10-CM | POA: Diagnosis not present

## 2023-03-16 DIAGNOSIS — R945 Abnormal results of liver function studies: Secondary | ICD-10-CM | POA: Diagnosis not present

## 2023-03-16 DIAGNOSIS — E291 Testicular hypofunction: Secondary | ICD-10-CM | POA: Diagnosis not present

## 2023-03-16 DIAGNOSIS — E349 Endocrine disorder, unspecified: Secondary | ICD-10-CM | POA: Diagnosis not present

## 2023-03-16 MED ORDER — PREDNISONE 10 MG PO TABS: ORAL_TABLET | ORAL | 0 refills | Status: DC

## 2023-03-16 MED ORDER — KETOROLAC TROMETHAMINE 60 MG/2ML IM SOLN
60.0000 mg | Freq: Once | INTRAMUSCULAR | Status: AC
  Administered 2023-03-16: 60 mg via INTRAMUSCULAR

## 2023-03-16 NOTE — Addendum Note (Signed)
Addended by: Deno Etienne on: 03/16/2023 08:36 AM   Modules accepted: Orders

## 2023-03-16 NOTE — Progress Notes (Signed)
Pt stated that he has been experiencing migraines for the past 4 days. He stated that it starts at the base of his head and moves forward. He has been taking IBU and Tylenol which helps some. By the evening he has to go to bed.  Pain is 4/10

## 2023-03-16 NOTE — Progress Notes (Signed)
Established Patient Office Visit  Subjective   Patient ID: Charles Crosby, male    DOB: 1976/05/28  Age: 47 y.o. MRN: [id number]  No chief complaint on file.   HPI  Day 4 of persistent HA, starts at base of skull and radiates forward.  Gets worse at the day goes on. Feels his vision is off as well. Are you hydrating well.  Pain is 4/10 this AM.   No major changes that would trigger his symptoms.  He has been using a little bit ibuprofen and Tylenol initially but says it really was not helping so he quit taking it.  He has not had any nausea.  No recent cold symptoms no sore throat or ear pain.  No weakness in the extremities.  He was dx with migraines and was getting shots in his head at that time.     ROS    Objective:     BP 121/63   Pulse 97   Ht 5\' 11"  (1.803 m)   Wt 188 lb (85.3 kg)   SpO2 97%   BMI 26.22 kg/m    Physical Exam Constitutional:      Appearance: He is well-developed and normal weight.  HENT:     Head: Normocephalic and atraumatic.     Right Ear: Tympanic membrane, ear canal and external ear normal.     Left Ear: Tympanic membrane, ear canal and external ear normal.     Nose: Nose normal.     Mouth/Throat:     Pharynx: Oropharynx is clear.  Eyes:     Conjunctiva/sclera: Conjunctivae normal.     Pupils: Pupils are equal, round, and reactive to light.  Neck:     Thyroid: No thyromegaly.  Cardiovascular:     Rate and Rhythm: Normal rate and regular rhythm.     Heart sounds: Normal heart sounds.  Pulmonary:     Effort: Pulmonary effort is normal.     Breath sounds: Normal breath sounds.  Musculoskeletal:     Cervical back: Neck supple.  Lymphadenopathy:     Cervical: No cervical adenopathy.  Skin:    General: Skin is warm and dry.  Neurological:     General: No focal deficit present.     Mental Status: He is alert and oriented to person, place, and time.     Cranial Nerves: No cranial nerve deficit.     Gait: Gait normal.  Psychiatric:         Mood and Affect: Mood normal.        Behavior: Behavior normal.      No results found for any visits on 03/16/23.    The 10-year ASCVD risk score (Arnett DK, et al., 2019) is: 2.5%    Assessment & Plan:   Problem List Items Addressed This Visit   None Visit Diagnoses     Status migrainosus    -  Primary      Status migrainous-we discussed options we will treat with a Toradol injection here today in the office he is really not having any nausea.  In the record I do a prednisone taper he will avoid any over-the-counter's for the next 5 days.  If the headache comes back or refer unable to break it then please let us know.  It sounds like in the past he probably had Botox injections.  And if we do need to refer him back to neurology we can.  Also encouraged him to work on cervical spine exercises it  sounds like his upper neck is a big trigger for him.  Return if symptoms worsen or fail to improve.    Nani Gasser, MD

## 2023-03-24 ENCOUNTER — Encounter: Payer: Self-pay | Admitting: Family Medicine

## 2023-03-24 ENCOUNTER — Encounter: Payer: Self-pay | Admitting: Neurology

## 2023-03-24 DIAGNOSIS — G43901 Migraine, unspecified, not intractable, with status migrainosus: Secondary | ICD-10-CM

## 2023-03-24 NOTE — Telephone Encounter (Signed)
Orders Placed This Encounter  Procedures   Ambulatory referral to Neurology    Referral Priority:   Routine    Referral Type:   Consultation    Referral Reason:   Specialty Services Required    Requested Specialty:   Neurology    Number of Visits Requested:   1    
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
## 2023-04-02 ENCOUNTER — Ambulatory Visit (INDEPENDENT_AMBULATORY_CARE_PROVIDER_SITE_OTHER): Payer: Federal, State, Local not specified - PPO | Admitting: Neurology

## 2023-04-02 ENCOUNTER — Telehealth: Payer: Self-pay

## 2023-04-02 ENCOUNTER — Encounter: Payer: Self-pay | Admitting: Neurology

## 2023-04-02 VITALS — BP 122/78 | HR 80 | Ht 69.0 in | Wt 193.0 lb

## 2023-04-02 DIAGNOSIS — G43011 Migraine without aura, intractable, with status migrainosus: Secondary | ICD-10-CM

## 2023-04-02 MED ORDER — AJOVY 225 MG/1.5ML ~~LOC~~ SOAJ: 225.0000 mg | SUBCUTANEOUS | 11 refills | Status: DC

## 2023-04-02 MED ORDER — SUMATRIPTAN SUCCINATE 100 MG PO TABS: ORAL_TABLET | ORAL | 5 refills | Status: AC

## 2023-04-02 NOTE — Telephone Encounter (Signed)
Patient seen in office today. Per Dr.Jaffe patient to start Ajovy.  PA team please start a PA for Ajovy 225mg .

## 2023-04-02 NOTE — Patient Instructions (Signed)
Plan is to start Ajovy injection every 28 days. At earliest onset of headache getting severe, take sumatriptan.  May repeat after 2 hours.  Maximum 2 tablets in 24 hours.  Limit use  to no more than 2 days out of week to prevent risk of rebound or medication-overuse headache.  This also includes over the counter pain relievers Keep headache diary Follow up 6 months.

## 2023-04-04 ENCOUNTER — Other Ambulatory Visit: Payer: Self-pay | Admitting: Family Medicine

## 2023-04-04 DIAGNOSIS — F332 Major depressive disorder, recurrent severe without psychotic features: Secondary | ICD-10-CM

## 2023-04-04 DIAGNOSIS — F4310 Post-traumatic stress disorder, unspecified: Secondary | ICD-10-CM

## 2023-04-05 ENCOUNTER — Telehealth: Payer: Self-pay | Admitting: Pharmacy Technician

## 2023-04-05 ENCOUNTER — Other Ambulatory Visit (HOSPITAL_COMMUNITY): Payer: Self-pay

## 2023-04-05 NOTE — Telephone Encounter (Signed)
PA has been submitted EXPEDITED, and telephone encounter has been created. Unable to process through [facility], will need to call insurance on Monday.

## 2023-04-05 NOTE — Telephone Encounter (Signed)
Patient Advocate Encounter  Received notification from [employer] that prior authorization for AJOVY 225MG  is required.   PA submitted on 5.19.24 Key [id number] Status is pending   WILL NEED TO CALL TO COMPLETE PA OVER THE PHONE.

## 2023-04-06 ENCOUNTER — Other Ambulatory Visit: Payer: Self-pay | Admitting: Family Medicine

## 2023-04-06 DIAGNOSIS — F4310 Post-traumatic stress disorder, unspecified: Secondary | ICD-10-CM

## 2023-04-06 DIAGNOSIS — F332 Major depressive disorder, recurrent severe without psychotic features: Secondary | ICD-10-CM

## 2023-04-07 MED ORDER — ZIPRASIDONE HCL 80 MG PO CAPS
80.0000 mg | ORAL_CAPSULE | Freq: Every day | ORAL | 0 refills | Status: DC
Start: 2023-04-07 — End: 2023-06-20

## 2023-04-07 MED ORDER — LAMOTRIGINE 100 MG PO TABS
100.0000 mg | ORAL_TABLET | Freq: Every evening | ORAL | 0 refills | Status: DC
Start: 2023-04-07 — End: 2023-06-20

## 2023-06-05 DIAGNOSIS — G44221 Chronic tension-type headache, intractable: Secondary | ICD-10-CM | POA: Diagnosis not present

## 2023-06-20 ENCOUNTER — Other Ambulatory Visit: Payer: Self-pay | Admitting: Family Medicine

## 2023-06-20 DIAGNOSIS — F4310 Post-traumatic stress disorder, unspecified: Secondary | ICD-10-CM

## 2023-06-20 DIAGNOSIS — F332 Major depressive disorder, recurrent severe without psychotic features: Secondary | ICD-10-CM

## 2023-06-22 MED ORDER — LAMOTRIGINE 100 MG PO TABS
100.0000 mg | ORAL_TABLET | Freq: Every evening | ORAL | 0 refills | Status: DC
Start: 2023-06-22 — End: 2023-07-03

## 2023-06-22 MED ORDER — ZIPRASIDONE HCL 80 MG PO CAPS
80.0000 mg | ORAL_CAPSULE | Freq: Every day | ORAL | 0 refills | Status: DC
Start: 2023-06-22 — End: 2023-07-03

## 2023-07-03 ENCOUNTER — Other Ambulatory Visit: Payer: Self-pay | Admitting: Family Medicine

## 2023-07-03 DIAGNOSIS — F4310 Post-traumatic stress disorder, unspecified: Secondary | ICD-10-CM

## 2023-07-03 DIAGNOSIS — F332 Major depressive disorder, recurrent severe without psychotic features: Secondary | ICD-10-CM

## 2023-07-09 ENCOUNTER — Ambulatory Visit: Payer: Federal, State, Local not specified - PPO | Admitting: Neurology

## 2023-08-03 ENCOUNTER — Ambulatory Visit (INDEPENDENT_AMBULATORY_CARE_PROVIDER_SITE_OTHER): Payer: Federal, State, Local not specified - PPO | Admitting: Family Medicine

## 2023-08-03 ENCOUNTER — Encounter: Payer: Self-pay | Admitting: Family Medicine

## 2023-08-03 VITALS — BP 119/62 | HR 82 | Ht 69.0 in | Wt 177.0 lb

## 2023-08-03 DIAGNOSIS — R7301 Impaired fasting glucose: Secondary | ICD-10-CM

## 2023-08-03 DIAGNOSIS — F332 Major depressive disorder, recurrent severe without psychotic features: Secondary | ICD-10-CM

## 2023-08-03 DIAGNOSIS — S069X9S Unspecified intracranial injury with loss of consciousness of unspecified duration, sequela: Secondary | ICD-10-CM

## 2023-08-03 DIAGNOSIS — E291 Testicular hypofunction: Secondary | ICD-10-CM | POA: Diagnosis not present

## 2023-08-03 DIAGNOSIS — G43009 Migraine without aura, not intractable, without status migrainosus: Secondary | ICD-10-CM | POA: Insufficient documentation

## 2023-08-03 DIAGNOSIS — F4310 Post-traumatic stress disorder, unspecified: Secondary | ICD-10-CM

## 2023-08-03 MED ORDER — LAMOTRIGINE 100 MG PO TABS: 100.0000 mg | ORAL_TABLET | Freq: Every evening | ORAL | 1 refills | Status: DC

## 2023-08-03 MED ORDER — ZIPRASIDONE HCL 80 MG PO CAPS: 80.0000 mg | ORAL_CAPSULE | Freq: Every day | ORAL | 1 refills | Status: DC

## 2023-08-03 NOTE — Assessment & Plan Note (Signed)
Off the Ajovy.  Still uses Imitrex as needed.  He has been putting a lot of focus on avoiding tensing the jaw etc. and that seems to have been helpful.

## 2023-08-03 NOTE — Assessment & Plan Note (Signed)
A1c 5.6 today which looks fantastic.  Continue to work on [employer] and regular exercise.

## 2023-08-03 NOTE — Assessment & Plan Note (Signed)
Happy with current regimen in regard to mood.  He does not feel like any adjustments need to be made.

## 2023-08-03 NOTE — Assessment & Plan Note (Signed)
Unfortunately he has had an increase in nightmares recently.  We did discuss that medication can sometimes be helpful with this.

## 2023-08-03 NOTE — Progress Notes (Signed)
Established Patient Office Visit  Subjective   Patient ID: Charles Crosby, male    DOB: 1976-03-16  Age: 47 y.o. MRN: [id number]  Chief Complaint  Patient presents with   Medical Management of Chronic Issues    HPI F/U MDD -was like his current mood regimen of Lamictal and Geodon is working fairly well and he has been happy with the regimen his only concern right now is his sleep.  Not sleeping well lately. Harder time falling asleep and starting to have more nightmares more recently.    Impaired fasting glucose-no increased thirst or urination. No symptoms consistent with hypoglycemia.  Says he really has worked to cut back on his intake of sweets.  No longer on Ajovy from Neurology for his headaches.  He has been really trying to work on voiding clenching his jaw try to relax facial muscles he really feels like that was triggering a lot of the headaches and it has been somewhat better.  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  Coming home on the weekends.  He has been happy to have something to keep his mind busy and occupied and focused on.    ROS    Objective:     BP 119/62   Pulse 82   Ht 5\' 9"  (1.753 m)   Wt 177 lb (80.3 kg)   SpO2 100%   BMI 26.14 kg/m    Physical Exam Vitals and nursing note reviewed.  Constitutional:      Appearance: Normal appearance.  HENT:     Head: Normocephalic and atraumatic.  Eyes:     Conjunctiva/sclera: Conjunctivae normal.  Cardiovascular:     Rate and Rhythm: Normal rate and regular rhythm.  Pulmonary:     Effort: Pulmonary effort is normal.     Breath sounds: Normal breath sounds.  Skin:    General: Skin is warm and dry.  Neurological:     Mental Status: He is alert.  Psychiatric:        Mood and Affect: Mood normal.      No results found for any visits on 08/03/23.    The 10-year ASCVD risk score (Arnett DK, et al., 2019) is: 2.7%    Assessment & Plan:    Problem List Items Addressed This Visit       Cardiovascular and Mediastinum   Migraine without aura and without status migrainosus, not intractable    Off the Ajovy.  Still uses Imitrex as needed.  He has been putting a lot of focus on avoiding tensing the jaw etc. and that seems to have been helpful.      Relevant Medications   lamoTRIgine (LAMICTAL) 100 MG tablet     Endocrine   Male hypogonadism   IFG (impaired fasting glucose) - Primary    A1c 5.6 today which looks fantastic.  Continue to work on [employer] and regular exercise.        Nervous and Auditory   Traumatic brain injury [facility])     Other   Post traumatic stress disorder (PTSD)    Unfortunately he has had an increase in nightmares recently.  We did discuss that medication can sometimes be helpful with this.      Relevant Medications   lamoTRIgine (LAMICTAL) 100 MG tablet   ziprasidone (GEODON) 80 MG capsule   MDD (major depressive disorder), recurrent severe, without psychosis (HCC)    Happy with current regimen in regard to  mood.  He does not feel like any adjustments need to be made.      Relevant Medications   lamoTRIgine (LAMICTAL) 100 MG tablet   ziprasidone (GEODON) 80 MG capsule    Have labs done about 2 weeks ago and we will get those faxed over to [place].  Return in about 6 months (around 01/31/2024) for Mood and prediabetes .    Nani Gasser, MD

## 2023-10-02 ENCOUNTER — Ambulatory Visit: Payer: Federal, State, Local not specified - PPO | Admitting: Family Medicine

## 2023-10-02 NOTE — Progress Notes (Deleted)
   Acute Office Visit  Subjective:     Patient ID: Charles Crosby, male    DOB: January 04, 1976, 47 y.o.   MRN: [id number]  No chief complaint on file.   HPI Patient is in today for cough  ROS      Objective:    There were no vitals taken for this visit. {Vitals History (Optional):23777}  Physical Exam  No results found for any visits on 10/02/23.      Assessment & Plan:   Problem List Items Addressed This Visit   None   No orders of the defined types were placed in this encounter.   No follow-ups on file.  Nani Gasser, MD
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
## 2023-10-06 ENCOUNTER — Encounter: Payer: Self-pay | Admitting: Neurology

## 2023-10-06 ENCOUNTER — Ambulatory Visit: Payer: Federal, State, Local not specified - PPO | Admitting: Neurology

## 2023-10-06 DIAGNOSIS — Z029 Encounter for administrative examinations, unspecified: Secondary | ICD-10-CM

## 2023-12-30 ENCOUNTER — Other Ambulatory Visit: Payer: Self-pay

## 2023-12-30 ENCOUNTER — Ambulatory Visit
Admission: EM | Admit: 2023-12-30 | Discharge: 2023-12-30 | Disposition: A | Discharge status: Home / Self Care | Payer: Federal, State, Local not specified - PPO

## 2023-12-30 DIAGNOSIS — H6122 Impacted cerumen, left ear: Secondary | ICD-10-CM | POA: Diagnosis not present

## 2023-12-30 NOTE — ED Triage Notes (Signed)
Patient reports he has an infected left ear drum. No fever. Patient took ibuprofen this morning.
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
## 2024-02-14 ENCOUNTER — Other Ambulatory Visit: Payer: Self-pay | Admitting: Family Medicine

## 2024-02-14 DIAGNOSIS — F4310 Post-traumatic stress disorder, unspecified: Secondary | ICD-10-CM

## 2024-02-14 DIAGNOSIS — F332 Major depressive disorder, recurrent severe without psychotic features: Secondary | ICD-10-CM

## 2024-03-24 DIAGNOSIS — E7849 Other hyperlipidemia: Secondary | ICD-10-CM | POA: Diagnosis not present

## 2024-03-24 DIAGNOSIS — Z131 Encounter for screening for diabetes mellitus: Secondary | ICD-10-CM | POA: Diagnosis not present

## 2024-03-24 DIAGNOSIS — R944 Abnormal results of kidney function studies: Secondary | ICD-10-CM | POA: Diagnosis not present

## 2024-03-24 DIAGNOSIS — E291 Testicular hypofunction: Secondary | ICD-10-CM | POA: Diagnosis not present

## 2024-03-24 DIAGNOSIS — F1020 Alcohol dependence, uncomplicated: Secondary | ICD-10-CM | POA: Diagnosis not present

## 2024-03-24 DIAGNOSIS — R945 Abnormal results of liver function studies: Secondary | ICD-10-CM | POA: Diagnosis not present

## 2024-03-24 DIAGNOSIS — D518 Other vitamin B12 deficiency anemias: Secondary | ICD-10-CM | POA: Diagnosis not present

## 2024-03-24 DIAGNOSIS — E559 Vitamin D deficiency, unspecified: Secondary | ICD-10-CM | POA: Diagnosis not present

## 2024-03-24 DIAGNOSIS — E039 Hypothyroidism, unspecified: Secondary | ICD-10-CM | POA: Diagnosis not present

## 2024-03-24 DIAGNOSIS — E8881 Metabolic syndrome: Secondary | ICD-10-CM | POA: Diagnosis not present

## 2024-03-24 DIAGNOSIS — Z125 Encounter for screening for malignant neoplasm of prostate: Secondary | ICD-10-CM | POA: Diagnosis not present

## 2024-03-27 ENCOUNTER — Other Ambulatory Visit: Payer: Self-pay | Admitting: Family Medicine

## 2024-03-27 DIAGNOSIS — F4310 Post-traumatic stress disorder, unspecified: Secondary | ICD-10-CM

## 2024-03-27 DIAGNOSIS — F332 Major depressive disorder, recurrent severe without psychotic features: Secondary | ICD-10-CM

## 2024-04-12 DIAGNOSIS — H2513 Age-related nuclear cataract, bilateral: Secondary | ICD-10-CM | POA: Diagnosis not present

## 2024-04-12 DIAGNOSIS — H5213 Myopia, bilateral: Secondary | ICD-10-CM | POA: Diagnosis not present

## 2024-04-12 DIAGNOSIS — H524 Presbyopia: Secondary | ICD-10-CM | POA: Diagnosis not present

## 2024-04-12 DIAGNOSIS — H52202 Unspecified astigmatism, left eye: Secondary | ICD-10-CM | POA: Diagnosis not present

## 2024-04-23 ENCOUNTER — Encounter: Payer: Self-pay | Admitting: Family Medicine

## 2024-04-23 DIAGNOSIS — F4310 Post-traumatic stress disorder, unspecified: Secondary | ICD-10-CM

## 2024-04-23 DIAGNOSIS — F332 Major depressive disorder, recurrent severe without psychotic features: Secondary | ICD-10-CM

## 2024-04-25 MED ORDER — ZIPRASIDONE HCL 80 MG PO CAPS: 80.0000 mg | ORAL_CAPSULE | Freq: Every day | ORAL | 0 refills | Status: DC

## 2024-04-25 MED ORDER — LAMOTRIGINE 100 MG PO TABS: 100.0000 mg | ORAL_TABLET | Freq: Every evening | ORAL | 0 refills | Status: DC

## 2024-04-25 NOTE — Telephone Encounter (Signed)
 Requesting rx rf of  Lamictal  100mg   Last written 03/28/2024 And  Geodone 80mg   Last written 03/28/2024 Per patient message [facility]  Requesting 90 day supply of medication until can be seen .  Last OV 09/16/224 Upcoming appt

## 2024-04-26 ENCOUNTER — Other Ambulatory Visit: Payer: Self-pay | Admitting: Family Medicine

## 2024-04-26 DIAGNOSIS — F4310 Post-traumatic stress disorder, unspecified: Secondary | ICD-10-CM

## 2024-04-26 DIAGNOSIS — F332 Major depressive disorder, recurrent severe without psychotic features: Secondary | ICD-10-CM

## 2024-04-27 NOTE — Telephone Encounter (Signed)
 Pls contact the pt to schedule MDD appt with Dr. Greer Leak. Past due. Unable to refill medications.

## 2024-04-27 NOTE — Telephone Encounter (Signed)
 Called patient, LVM to call office to schedule appt, thanks.

## 2024-06-10 DIAGNOSIS — G2401 Drug induced subacute dyskinesia: Secondary | ICD-10-CM | POA: Diagnosis not present

## 2024-06-10 DIAGNOSIS — R059 Cough, unspecified: Secondary | ICD-10-CM | POA: Diagnosis not present

## 2024-06-10 DIAGNOSIS — G44221 Chronic tension-type headache, intractable: Secondary | ICD-10-CM | POA: Diagnosis not present

## 2024-06-10 DIAGNOSIS — E88811 Insulin resistance syndrome, type a: Secondary | ICD-10-CM | POA: Diagnosis not present

## 2024-06-10 DIAGNOSIS — R945 Abnormal results of liver function studies: Secondary | ICD-10-CM | POA: Diagnosis not present

## 2024-06-10 DIAGNOSIS — E291 Testicular hypofunction: Secondary | ICD-10-CM | POA: Diagnosis not present

## 2024-06-10 DIAGNOSIS — R454 Irritability and anger: Secondary | ICD-10-CM | POA: Diagnosis not present

## 2024-07-19 ENCOUNTER — Encounter: Payer: Self-pay | Admitting: Sports Medicine

## 2024-07-31 ENCOUNTER — Other Ambulatory Visit: Payer: Self-pay | Admitting: Family Medicine

## 2024-07-31 DIAGNOSIS — F4310 Post-traumatic stress disorder, unspecified: Secondary | ICD-10-CM

## 2024-07-31 DIAGNOSIS — F332 Major depressive disorder, recurrent severe without psychotic features: Secondary | ICD-10-CM

## 2024-09-19 DIAGNOSIS — G44221 Chronic tension-type headache, intractable: Secondary | ICD-10-CM | POA: Diagnosis not present

## 2024-09-19 DIAGNOSIS — Z125 Encounter for screening for malignant neoplasm of prostate: Secondary | ICD-10-CM | POA: Diagnosis not present

## 2024-09-19 DIAGNOSIS — R944 Abnormal results of kidney function studies: Secondary | ICD-10-CM | POA: Diagnosis not present

## 2024-09-19 DIAGNOSIS — E039 Hypothyroidism, unspecified: Secondary | ICD-10-CM | POA: Diagnosis not present

## 2024-09-19 DIAGNOSIS — E291 Testicular hypofunction: Secondary | ICD-10-CM | POA: Diagnosis not present

## 2024-09-19 DIAGNOSIS — E88811 Insulin resistance syndrome, type a: Secondary | ICD-10-CM | POA: Diagnosis not present

## 2024-09-19 DIAGNOSIS — E617 Deficiency of multiple nutrient elements: Secondary | ICD-10-CM | POA: Diagnosis not present

## 2024-09-19 DIAGNOSIS — R945 Abnormal results of liver function studies: Secondary | ICD-10-CM | POA: Diagnosis not present

## 2024-09-19 DIAGNOSIS — R059 Cough, unspecified: Secondary | ICD-10-CM | POA: Diagnosis not present

## 2024-09-19 LAB — LAB REPORT - SCANNED
A1c: 5.7
EGFR: 61
Free T4: 0.92 ng/dL
TSH: 0.26 — AB (ref 0.41–5.90)

## 2024-09-21 DIAGNOSIS — R1013 Epigastric pain: Secondary | ICD-10-CM | POA: Diagnosis not present

## 2024-09-21 DIAGNOSIS — E291 Testicular hypofunction: Secondary | ICD-10-CM | POA: Diagnosis not present

## 2024-09-21 DIAGNOSIS — E88811 Insulin resistance syndrome, type a: Secondary | ICD-10-CM | POA: Diagnosis not present

## 2024-09-21 DIAGNOSIS — R634 Abnormal weight loss: Secondary | ICD-10-CM | POA: Diagnosis not present

## 2024-09-22 LAB — CYSTATIN C WITH EGFR: EGFR: 82

## 2024-09-30 DIAGNOSIS — K824 Cholesterolosis of gallbladder: Secondary | ICD-10-CM | POA: Diagnosis not present

## 2024-09-30 DIAGNOSIS — R1013 Epigastric pain: Secondary | ICD-10-CM | POA: Diagnosis not present

## 2024-09-30 DIAGNOSIS — R634 Abnormal weight loss: Secondary | ICD-10-CM | POA: Diagnosis not present

## 2024-11-02 ENCOUNTER — Encounter: Payer: Self-pay | Admitting: Family Medicine

## 2024-11-02 ENCOUNTER — Ambulatory Visit

## 2024-11-02 ENCOUNTER — Ambulatory Visit: Admitting: Family Medicine

## 2024-11-02 VITALS — BP 114/69 | HR 75 | Ht 69.0 in | Wt 157.0 lb

## 2024-11-02 DIAGNOSIS — F4310 Post-traumatic stress disorder, unspecified: Secondary | ICD-10-CM | POA: Diagnosis not present

## 2024-11-02 DIAGNOSIS — G2401 Drug induced subacute dyskinesia: Secondary | ICD-10-CM | POA: Insufficient documentation

## 2024-11-02 DIAGNOSIS — R790 Abnormal level of blood mineral: Secondary | ICD-10-CM

## 2024-11-02 DIAGNOSIS — Z8739 Personal history of other diseases of the musculoskeletal system and connective tissue: Secondary | ICD-10-CM | POA: Diagnosis not present

## 2024-11-02 DIAGNOSIS — R051 Acute cough: Secondary | ICD-10-CM

## 2024-11-02 DIAGNOSIS — R059 Cough, unspecified: Secondary | ICD-10-CM | POA: Diagnosis not present

## 2024-11-02 DIAGNOSIS — R7989 Other specified abnormal findings of blood chemistry: Secondary | ICD-10-CM

## 2024-11-02 DIAGNOSIS — F332 Major depressive disorder, recurrent severe without psychotic features: Secondary | ICD-10-CM | POA: Diagnosis not present

## 2024-11-02 DIAGNOSIS — M79621 Pain in right upper arm: Secondary | ICD-10-CM

## 2024-11-02 DIAGNOSIS — Z125 Encounter for screening for malignant neoplasm of prostate: Secondary | ICD-10-CM | POA: Diagnosis not present

## 2024-11-02 DIAGNOSIS — M79601 Pain in right arm: Secondary | ICD-10-CM | POA: Diagnosis not present

## 2024-11-02 DIAGNOSIS — R634 Abnormal weight loss: Secondary | ICD-10-CM | POA: Diagnosis not present

## 2024-11-02 DIAGNOSIS — R198 Other specified symptoms and signs involving the digestive system and abdomen: Secondary | ICD-10-CM

## 2024-11-02 NOTE — Progress Notes (Signed)
 Established Patient Office Visit  Patient ID: Charles Crosby, male    DOB: [birth year]  Age: 48 y.o. MRN: [id number] PCP: Charles Dorothyann BIRCH, MD  Chief Complaint  Patient presents with   unexplained weight loss    Subjective:     HPI  Discussed the use of AI scribe software for clinical note transcription with the patient, who gave verbal consent to proceed.  History of Present Illness Charles Crosby is a 48 year old male who presents with abnormal weight loss.  Unintentional weight loss - Approximately 20 pounds lost over the past three months - No changes in eating habits - Attempts to increase food intake without effect - No associated fevers, night sweats, unusual rashes, or changes in bowel movements  Psychotropic medication changes and adverse effects - Geodon  discontinued three months ago, coinciding with onset of weight loss - Discontinuation associated with insomnia, with some improvement since stopping - Tardive dyskinesia with substantial facial movements and muscle tension while on Geodon , slightly improved after discontinuation - Currently taking lamotrigine  100 mg in the morning and 100 mg at bedtime - Brief trial of Wellbutrin  discontinued after two weeks due to increased anger and decreased motivation - No new supplements or over-the-counter vitamins  Testosterone  replacement therapy - History of testosterone  replacement therapy with pellets, which caused supratherapeutic levels - Plans to switch to bi-weekly injections for improved regulation  Musculoskeletal pain - Right upper arm pain, localized between bicep and tricep, present for a couple of weeks  Respiratory symptoms - Persistent cough affecting household  Thyroid function abnormalities - Discrepancy in thyroid test results between [facility] and another facility - [facility] - Endocrinology referral scheduled in two months     ROS    Objective:     BP 114/69    Pulse 75   Ht 5' 9 (1.753 m)   Wt 157 lb (71.2 kg)   SpO2 99%   BMI 23.18 kg/m    Physical Exam Constitutional:      Appearance: Normal appearance.  HENT:     Head: Normocephalic and atraumatic.     Right Ear: Tympanic membrane, ear canal and external ear normal. There is no impacted cerumen.     Left Ear: Tympanic membrane, ear canal and external ear normal. There is no impacted cerumen.     Nose: Nose normal.     Mouth/Throat:     Pharynx: Oropharynx is clear.  Eyes:     Conjunctiva/sclera: Conjunctivae normal.  Cardiovascular:     Rate and Rhythm: Normal rate and regular rhythm.  Pulmonary:     Effort: Pulmonary effort is normal.     Breath sounds: Normal breath sounds.  Musculoskeletal:     Cervical back: Neck supple. No tenderness.     Comments: Tender between the bicpe and tricep on the right upper arm.   Lymphadenopathy:     Head:     Right side of head: No submandibular adenopathy.     Left side of head: No submandibular adenopathy.     Cervical: No cervical adenopathy.     Right cervical: No superficial cervical adenopathy.    Left cervical: No superficial cervical adenopathy.     Upper Body:     Right upper body: No supraclavicular or axillary adenopathy.     Left upper body: No supraclavicular or axillary adenopathy.  Skin:    General: Skin is warm and dry.  Neurological:     Mental Status: He is alert  and oriented to person, place, and time.  Psychiatric:        Mood and Affect: Mood normal.      No results found for any visits on 11/02/24.    The 10-year ASCVD risk score (Arnett DK, et al., 2019) is: 2.8%    Assessment & Plan:   Problem List Items Addressed This Visit       Nervous and Auditory   Tardive dyskinesia   Now off Geodon  and sxs a little better.         Other   Post traumatic stress disorder (PTSD)   MDD (major depressive disorder), recurrent severe, without psychosis (HCC)   Low ferritin   Relevant Orders   Lipid  Panel With LDL/HDL Ratio   Fe+TIBC+Fer   TSH + free T4   CBC with Differential/Platelet   CK (Creatine Kinase)   Sedimentation rate   C-reactive protein   History of rhabdomyolysis   Relevant Orders   Lipid Panel With LDL/HDL Ratio   Fe+TIBC+Fer   TSH + free T4   CBC with Differential/Platelet   CK (Creatine Kinase)   Sedimentation rate   C-reactive protein   Elevated serum creatinine   Relevant Orders   Lipid Panel With LDL/HDL Ratio   Fe+TIBC+Fer   TSH + free T4   CBC with Differential/Platelet   CK (Creatine Kinase)   Sedimentation rate   C-reactive protein   Other Visit Diagnoses       Unexplained weight loss    -  Primary   Relevant Orders   Lipid Panel With LDL/HDL Ratio   Fe+TIBC+Fer   TSH + free T4   CBC with Differential/Platelet   CK (Creatine Kinase)   Sedimentation rate   C-reactive protein     Screening for malignant neoplasm of prostate         Pain of right upper arm       Relevant Orders   DG Humerus Right     Acute cough       Relevant Orders   DG Chest 2 View       Assessment and Plan Assessment & Plan Unexplained weight loss Significant weight loss of > 20 pounds over three months. Differential includes hyperthyroidism, malignancy, and metabolic disorders. Conflicting thyroid results from [facility] and other providers. - Ordered repeat thyroid function tests including TSH, T4 and T3. - PSA on labs done in Nov were normal.  - Ordered chest x-ray to evaluate for potential malignancy ( current cough/former smoker) or other causes of weight loss. - Ordered blood work including inflammatory markers (sed rate and CRP). - Encouraged increased caloric intake.  Suspected hyperthyroidism Conflicting thyroid function reports. If confirmed, hyperthyroidism could explain weight loss. - Ordered repeat thyroid function tests including T4 and T3. - If confirmed, initiate treatment and refer to endocrinologist. Has appt in about 2-3 months.   Tardive  dyskinesia Facial movements and muscle tension likely due to long-term Geodon  use. Symptoms improved post-discontinuation, but some persist. - Continue to monitor symptoms and consider further management if symptoms persist or worsen.  Major depressive disorder, recurrent, severe Managed with lamotrigine . Wellbutrin  discontinued due to irritability and lack of mood improvement. Lamotrigine  increased to 100 mg twice daily. - Continue lamotrigine  100 mg twice daily.  Testosterone  deficiency Low testosterone  levels. Previous pellet therapy caused excessively high levels. Switching to injections for better regulation. - Initiated testosterone  injections twice weekly.  General health maintenance Due for routine vaccinations. - Administered tetanus, shingles, and RSV vaccinations.  Return if symptoms worsen or fail to improve.    Dorothyann Byars, MD [facility] at [facility]

## 2024-11-02 NOTE — Assessment & Plan Note (Signed)
 Now off Geodon  and sxs a little better.

## 2024-11-03 ENCOUNTER — Encounter: Payer: Self-pay | Admitting: Family Medicine

## 2024-11-03 ENCOUNTER — Ambulatory Visit: Payer: Self-pay | Admitting: Family Medicine

## 2024-11-03 LAB — TSH+FREE T4
Free T4: 1.12 ng/dL (ref 0.82–1.77)
TSH: 0.282 u[IU]/mL — ABNORMAL LOW (ref 0.450–4.500)

## 2024-11-03 LAB — LIPID PANEL WITH LDL/HDL RATIO
Cholesterol, Total: 173 mg/dL (ref 100–199)
HDL: 43 mg/dL (ref >39)
LDL Chol Calc (NIH): 120 mg/dL — ABNORMAL HIGH (ref 0–99)
LDL/HDL Ratio: 2.8 ratio (ref 0.0–3.6)
Triglycerides: 49 mg/dL (ref 0–149)
VLDL Cholesterol Cal: 10 mg/dL (ref 5–40)

## 2024-11-03 LAB — CBC WITH DIFFERENTIAL/PLATELET
Basophils Absolute: 0.1 x10E3/uL (ref 0.0–0.2)
Basos: 1 %
EOS (ABSOLUTE): 0.2 x10E3/uL (ref 0.0–0.4)
Eos: 3 %
Hematocrit: 49 % (ref 37.5–51.0)
Hemoglobin: 15.8 g/dL (ref 13.0–17.7)
Immature Grans (Abs): 0 x10E3/uL (ref 0.0–0.1)
Immature Granulocytes: 0 %
Lymphocytes Absolute: 1 x10E3/uL (ref 0.7–3.1)
Lymphs: 19 %
MCH: 29.6 pg (ref 26.6–33.0)
MCHC: 32.2 g/dL (ref 31.5–35.7)
MCV: 92 fL (ref 79–97)
Monocytes Absolute: 0.4 x10E3/uL (ref 0.1–0.9)
Monocytes: 8 %
Neutrophils Absolute: 3.4 x10E3/uL (ref 1.4–7.0)
Neutrophils: 69 %
Platelets: 318 x10E3/uL (ref 150–450)
RBC: 5.33 x10E6/uL (ref 4.14–5.80)
RDW: 12.5 % (ref 11.6–15.4)
WBC: 4.9 x10E3/uL (ref 3.4–10.8)

## 2024-11-03 LAB — SEDIMENTATION RATE: Sed Rate: 21 mm/h — ABNORMAL HIGH (ref 0–15)

## 2024-11-03 LAB — IRON,TIBC AND FERRITIN PANEL
Ferritin: 278 ng/mL (ref 30–400)
Iron Saturation: 45 % (ref 15–55)
Iron: 139 ug/dL (ref 38–169)
Total Iron Binding Capacity: 311 ug/dL (ref 250–450)
UIBC: 172 ug/dL (ref 111–343)

## 2024-11-03 LAB — CK: Total CK: 195 U/L (ref 49–439)

## 2024-11-03 LAB — C-REACTIVE PROTEIN: CRP: 12 mg/L — ABNORMAL HIGH (ref 0–10)

## 2024-11-03 NOTE — Progress Notes (Signed)
 Hi Charles Crosby,  Your LDL cholesterol is up a little ( not related to you losing weight but we were due to check it).  So interestingly your thyroid level is in what we would call the subclinical range.  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  It is a most like catching prediabetes before someone becomes diabetic so typically in the early stages when there is not a shift in the free T4 it does not usually cause major symptoms at least not to the point that you are currently experiencing.  I was able to see the ultrasound report that you mentioned which is great and they were still waiting on the chest x-ray and the bone x-ray.  Hopefully this will come back tomorrow.  Inflammatory markers are mildly elevated but not in the range that we would typically see with true autoimmune disorders that can cause weight loss.  Your iron levels look good.  Blood count is normal no sign of leukemia etc.  I also went ahead and checked your muscle enzyme level as you have had issues with rhabdomyolysis in the past and everything checks out normal this time.

## 2024-11-08 NOTE — Progress Notes (Signed)
 Typically with subclinical it wouldn't cause this big of weight loss. I think doing a colonoscopy is the next best step just to make sure that everything is okay with a good and to screen for colon cancer especially since over the age of 31.  We have made the referral for that

## 2024-11-11 ENCOUNTER — Telehealth: Payer: Self-pay

## 2024-11-11 NOTE — Telephone Encounter (Signed)
 Copied from CRM #[id number]. Topic: Clinical - Lab/Test Results >> Nov 08, 2024  1:00 PM Rachelle R wrote: Reason for CRM: Patient calling for the results of his xrays. Is requesting a call once reviewed.  Can be reached at [phone]

## 2024-11-11 NOTE — Telephone Encounter (Signed)
 Spoke with Miranda from imaging who is going to contact [place] [facility] to see if we can get it bumped up. The patient is aware as soon as we get an update, we will contact him. Voiced his understanding

## 2024-11-14 NOTE — Progress Notes (Signed)
 Good news!  No sign of growth or tumor on the bone in your arm.

## 2024-11-14 NOTE — Progress Notes (Signed)
 Chest x-ray overall looks good no concerning findings no sign of mass or fluid etc.

## 2024-11-18 ENCOUNTER — Ambulatory Visit: Payer: Self-pay | Admitting: Family Medicine

## 2024-11-18 NOTE — Progress Notes (Signed)
 Recommend rpeat thyroid panel in 6-8 weeks
